# Patient Record
Sex: Female | Born: 1985 | Race: Black or African American | Hispanic: No | Marital: Married | Smoking: Never smoker
Health system: Southern US, Community
[De-identification: ages and names within clinical notes are randomized; demographics above are authoritative.]

## PROBLEM LIST (undated history)

## (undated) ENCOUNTER — Inpatient Hospital Stay (HOSPITAL_COMMUNITY): Payer: Self-pay

## (undated) DIAGNOSIS — Z3493 Encounter for supervision of normal pregnancy, unspecified, third trimester: Secondary | ICD-10-CM

## (undated) DIAGNOSIS — F419 Anxiety disorder, unspecified: Secondary | ICD-10-CM

## (undated) HISTORY — PX: NO PAST SURGERIES: SHX2092

---
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
## 2004-04-27 ENCOUNTER — Ambulatory Visit: Payer: Self-pay | Admitting: Family Medicine

## 2004-04-27 ENCOUNTER — Ambulatory Visit (HOSPITAL_COMMUNITY): Admission: RE | Admit: 2004-04-27 | Discharge: 2004-04-27 | Payer: Self-pay | Admitting: Family Medicine

## 2004-05-03 ENCOUNTER — Ambulatory Visit: Payer: Self-pay | Admitting: Family Medicine

## 2004-07-01 ENCOUNTER — Emergency Department (HOSPITAL_COMMUNITY): Admission: EM | Admit: 2004-07-01 | Discharge: 2004-07-01 | Payer: Self-pay | Admitting: Emergency Medicine

## 2004-07-02 ENCOUNTER — Emergency Department (HOSPITAL_COMMUNITY): Admission: EM | Admit: 2004-07-02 | Discharge: 2004-07-02 | Payer: Self-pay | Admitting: Emergency Medicine

## 2004-07-20 ENCOUNTER — Ambulatory Visit: Payer: Self-pay | Admitting: Family Medicine

## 2006-02-18 ENCOUNTER — Inpatient Hospital Stay (HOSPITAL_COMMUNITY): Admission: AD | Admit: 2006-02-18 | Discharge: 2006-02-18 | Payer: Self-pay | Admitting: Obstetrics and Gynecology

## 2006-02-24 ENCOUNTER — Inpatient Hospital Stay (HOSPITAL_COMMUNITY): Admission: AD | Admit: 2006-02-24 | Discharge: 2006-02-27 | Payer: Self-pay | Admitting: Obstetrics and Gynecology

## 2007-01-22 ENCOUNTER — Encounter: Payer: Self-pay | Admitting: Family Medicine

## 2007-03-18 ENCOUNTER — Emergency Department (HOSPITAL_COMMUNITY): Admission: EM | Admit: 2007-03-18 | Discharge: 2007-03-18 | Payer: Self-pay | Admitting: Family Medicine

## 2008-03-07 ENCOUNTER — Ambulatory Visit (HOSPITAL_COMMUNITY): Admission: RE | Admit: 2008-03-07 | Discharge: 2008-03-07 | Payer: Self-pay | Admitting: Occupational Medicine

## 2009-01-02 ENCOUNTER — Emergency Department (HOSPITAL_COMMUNITY): Admission: EM | Admit: 2009-01-02 | Discharge: 2009-01-02 | Payer: Self-pay | Admitting: Emergency Medicine

## 2009-03-09 ENCOUNTER — Emergency Department (HOSPITAL_COMMUNITY): Admission: EM | Admit: 2009-03-09 | Discharge: 2009-03-09 | Payer: Self-pay | Admitting: Family Medicine

## 2010-02-20 NOTE — Letter (Signed)
Summary: RPC chart  RPC chart   Imported By: Curtis Sites 07/26/2009 16:12:23  _____________________________________________________________________  External Attachment:    Type:   Image     Comment:   External Document

## 2010-06-08 NOTE — Discharge Summary (Signed)
NAMEMarland Kitchen  Susan Hart, Susan Hart       ACCOUNT NO.:  [id number]   MEDICAL RECORD NO.:  [id number]          PATIENT TYPE:  INP   LOCATION:  9138                          FACILITY:  [facility]   PHYSICIAN:  Sherron Monday, MD        DATE OF BIRTH:  Feb 23, 1985   DATE OF ADMISSION:  02/24/2006  DATE OF DISCHARGE:  02/27/2006                               DISCHARGE SUMMARY   ADMISSION DIAGNOSIS:  Intrauterine pregnancy with questionable loss of  fluid at term.   DISCHARGE DIAGNOSIS:  Intrauterine pregnancy with questionable loss of  fluid at term, delivered by spontaneous vaginal delivery.   HISTORY OF PRESENT ILLNESS:  A 25 year old G1, P0, at 40 weeks and 4  days, who presents with a complaint of questionable loss of fluid,  Nitrazine positive, fern negative, and no gross pool but mucousy  discharge.  It was thought that this likely confirmed her rupture of  membranes and given her post dates, she was sent for induction.  Her EDC  was February 20, 2006, by an 11-week ultrasound.  Prenatal care  uncomplicated except an ASCUS Pap with a negative colposcopy.  She will  repeat the Pap smear postpartum.   PAST MEDICAL HISTORY:  Not significant.   PAST SURGICAL HISTORY:  Not significant.   OB/GYN HISTORY:  She is a G1.  G1 is the present pregnancy.  There are  no complications.  Her GYN history:  She has a history of ASCUS Pap, had  a colposcopy.   MEDICATIONS:  None.   ALLERGIES:  None.   SOCIAL HISTORY:  Denies alcohol, tobacco or drug use during the  pregnancy.   PRENATAL LABORATORY DATA:  She is O positive, antibody screen negative.  Rubella immune.  Hepatitis B surface antigen negative.  HIV negative.  Gonorrhea negative.  Chlamydia negative.  Group B strep negative.   On admission she was afebrile with vital signs stable.  Her labor was  induced with Pitocin.  After admission an SROM, in fact, did occur and  moderate meconium was noted.  The patient received her epidural after  this.   An amnioinfusion was begun with the rupture and variables.  Her  course was otherwise uncomplicated.  She changed rapidly from 2-3 to 6  with some variables that were easily relieved with position change.  She  progressed to complete-complete, +3, and pushed to deliver a viable  female infant at 12:02 from OA to LOT with a mild shoulder dystocia  relieved with McRoberts.  The fetal scalp electrode fell off  approximately 2 minutes before delivery and we were unable to get the  heart rate again before delivery.  At delivery weight was 7 pounds 2  ounces, Apgars were 2 at one minute and 8 at five minutes.  The cord gas  was 7.30, venous cord gas 7.30.  The placenta was delivered intact at  12:05.  Periurethral laceration was repaired with 3-0 Vicryl in the  typical fashion.  EBL was less than 500 mL.  NICU was present at her  delivery to assess the infant.  Her postpartum course was relatively  uncomplicated.  She had one  temperature the evening of delivery day with  no other temperatures, and her fundus was nontender the whole time.  Otherwise, her vital signs remained stable.  Her hemoglobin decreased  from 14.2 to 12.2.  She had normal lochia the day of discharge and was  able to ambulate without difficulty and was tolerating p.o.  She was  discharged home with a prescription for Motrin, prenatal vitamins and  Vicodin.   DISCHARGE INFORMATION:  She is O positive.  Rubella immune.  Hemoglobin  14.3.  She will breast-feed.  She plans to start a contraceptive pill  like Seasonale at her 6-week checkup.      Sherron Monday, MD  Electronically Signed     JB/MEDQ  D:  02/27/2006  T:  02/27/2006  Job:  [id number]

## 2010-10-12 LAB — INFLUENZA A AND B ANTIGEN (CONVERTED LAB): Inflenza A Ag: NEGATIVE

## 2012-01-22 ENCOUNTER — Encounter (HOSPITAL_COMMUNITY): Payer: Self-pay | Admitting: Emergency Medicine

## 2012-01-22 ENCOUNTER — Emergency Department (INDEPENDENT_AMBULATORY_CARE_PROVIDER_SITE_OTHER)
Admission: EM | Admit: 2012-01-22 | Discharge: 2012-01-22 | Disposition: A | Discharge status: Home / Self Care | Payer: Self-pay | Source: Home / Self Care

## 2012-01-22 DIAGNOSIS — J45909 Unspecified asthma, uncomplicated: Secondary | ICD-10-CM

## 2012-01-22 DIAGNOSIS — J069 Acute upper respiratory infection, unspecified: Secondary | ICD-10-CM

## 2012-01-22 MED ORDER — ALBUTEROL SULFATE (5 MG/ML) 0.5% IN NEBU
2.5000 mg | INHALATION_SOLUTION | Freq: Once | RESPIRATORY_TRACT | Status: AC
  Administered 2012-01-22: 2.5 mg via RESPIRATORY_TRACT

## 2012-01-22 MED ORDER — PREDNISONE 20 MG PO TABS: ORAL_TABLET | ORAL | Status: DC

## 2012-01-22 MED ORDER — ALBUTEROL SULFATE (5 MG/ML) 0.5% IN NEBU
INHALATION_SOLUTION | RESPIRATORY_TRACT | Status: AC
  Filled 2012-01-22: qty 0.5

## 2012-01-22 MED ORDER — ALBUTEROL SULFATE HFA 108 (90 BASE) MCG/ACT IN AERS: 2.0000 | INHALATION_SPRAY | RESPIRATORY_TRACT | Status: DC | PRN

## 2012-01-22 MED ORDER — PHENYLEPHRINE-CHLORPHEN-DM 10-4-12.5 MG/5ML PO LIQD: 5.0000 mL | ORAL | Status: DC | PRN

## 2012-01-22 MED ORDER — IPRATROPIUM BROMIDE 0.02 % IN SOLN
0.5000 mg | Freq: Once | RESPIRATORY_TRACT | Status: AC
  Administered 2012-01-22: 0.5 mg via RESPIRATORY_TRACT

## 2012-01-22 NOTE — ED Provider Notes (Signed)
History     CSN: [id number]  Arrival date & time 01/22/12  1010   First MD Initiated Contact with Patient 01/22/12 1039      Chief Complaint  Patient presents with  . URI    (Consider location/radiation/quality/duration/timing/severity/associated sxs/prior treatment) HPI Comments: 27 year old female with a "bad cough" with chest pain associated with coughing for 4 days. She is also having some posttussive emesis that worsened when she had her throat swabbed in the exam room. She is complaining with a sore throat associated with cough only. Associated symptoms include runny nose, nasal congestion, headache and malaise. She denies fever, earache or shortness of breath. She has no history of asthma and is a nonsmoker   History reviewed. No pertinent past medical history.  History reviewed. No pertinent past surgical history.  No family history on file.  History  Substance Use Topics  . Smoking status: Never Smoker   . Smokeless tobacco: Not on file  . Alcohol Use: No    OB History    Grav Para Term Preterm Abortions TAB SAB Ect Mult Living                  Review of Systems  Constitutional: Negative for fever, chills and activity change.  HENT: Positive for congestion, sore throat, rhinorrhea and postnasal drip. Negative for facial swelling, trouble swallowing, neck pain and neck stiffness.   Eyes: Negative.   Respiratory: Positive for cough. Negative for shortness of breath.   Cardiovascular: Negative.   Gastrointestinal: Negative.   Genitourinary: Negative.   Skin: Negative for pallor and rash.  Neurological: Negative.   Psychiatric/Behavioral: Negative.     Allergies  Review of patient's allergies indicates no known allergies.  Home Medications   Current Outpatient Rx  Name  Route  Sig  Dispense  Refill  . ALBUTEROL SULFATE HFA 108 (90 BASE) MCG/ACT IN AERS   Inhalation   Inhale 2 puffs into the lungs every 4 (four) hours as needed for wheezing.   1  Inhaler   0   . PHENYLEPHRINE-CHLORPHEN-DM 10-25-10.5 MG/5ML PO LIQD   Oral   Take 5 mLs by mouth every 4 (four) hours as needed.   120 mL   0   . PREDNISONE 20 MG PO TABS      Take 3 tabs po on first day, 2 tabs second day, 2 tabs third day, 1 tab fourth day, 1 tab 5th day. Take with food.   9 tablet   0     BP 122/70  Pulse 80  Temp 99.6 F (37.6 C) (Oral)  Resp 18  SpO2 100%  LMP 01/13/2012  Physical Exam  Constitutional: She is oriented to person, place, and time. She appears well-developed and well-nourished. No distress.  HENT:       Bilateral TMs with patches of erythema. No bulging or evidence of rupture. Retraction of the right TM. Oropharynx injected. No exudates.  Eyes: EOM are normal.  Neck: Normal range of motion. Neck supple.  Cardiovascular: Normal rate, regular rhythm and normal heart sounds.   Pulmonary/Chest: Effort normal. No respiratory distress. She has wheezes. She has no rales.       Breath sounds are clear with normal respirations however with forced expiration and cough there is bilateral coarseness. As I entered the room she was having coughing spasms which was causing posttussive regurgitation/emesis.  Musculoskeletal: Normal range of motion. She exhibits no edema.  Lymphadenopathy:    She has no cervical adenopathy.  Neurological: She  is alert and oriented to person, place, and time.  Skin: Skin is warm and dry. No rash noted.  Psychiatric: She has a normal mood and affect.    ED Course  Procedures (including critical care time)  Labs Reviewed - No data to display No results found.   1. URI (upper respiratory infection)   2. RAD (reactive airway disease)       MDM  Duo neb Post neb the patient states she felt some better. Auscultation she had no more wheezes and coarseness with deep inspiration and expiration. Cough she did have some coarseness. She is moving air quite well prior to discharge and inspiratory equals expiratory. She  is discharged in stable condition. She initially developed URI symptoms followed by mild reactive airway disease symptoms. She will be treated with albuterol HFA 2 puffs every 4 hours when necessary cough and wheeze Norell CS 1 teaspoon every 4 hours when necessary cough congestion and drainage. Prednisone 20 mg Tablets to 5 day taper Drink plenty of fluids and stay well-hydrated She was advised to seek medical attention should she develop shortness of breath, fever and additional coughing. She is discharged in a stable condition and states she feels that she may be able to go to work tomorrow.         Hayden Rasmussen, NP 01/22/12 1141

## 2012-01-22 NOTE — ED Notes (Signed)
Pt c/o cold sx x5 days... Sx include: headache, cough, chest discomfort due to cough, nasal congestion, runny nose, nauseas... Denies: fevers, vomiting, diarrhea... She is alert w/no signs of acute distress.

## 2012-01-23 NOTE — ED Provider Notes (Signed)
Medical screening examination/treatment/procedure(s) were performed by resident physician or non-physician practitioner and as supervising physician I was immediately available for consultation/collaboration.   Shakevia Sarris DOUGLAS MD.    Hunt Zajicek D Anara Cowman, MD 01/23/12 2028 

## 2012-04-21 ENCOUNTER — Emergency Department (INDEPENDENT_AMBULATORY_CARE_PROVIDER_SITE_OTHER)
Admission: EM | Admit: 2012-04-21 | Discharge: 2012-04-21 | Disposition: A | Discharge status: Home / Self Care | Payer: Self-pay | Source: Home / Self Care

## 2012-04-21 ENCOUNTER — Encounter (HOSPITAL_COMMUNITY): Payer: Self-pay | Admitting: *Deleted

## 2012-04-21 DIAGNOSIS — S139XXA Sprain of joints and ligaments of unspecified parts of neck, initial encounter: Secondary | ICD-10-CM

## 2012-04-21 DIAGNOSIS — L218 Other seborrheic dermatitis: Secondary | ICD-10-CM

## 2012-04-21 DIAGNOSIS — L219 Seborrheic dermatitis, unspecified: Secondary | ICD-10-CM

## 2012-04-21 DIAGNOSIS — S161XXA Strain of muscle, fascia and tendon at neck level, initial encounter: Secondary | ICD-10-CM

## 2012-04-21 DIAGNOSIS — M542 Cervicalgia: Secondary | ICD-10-CM

## 2012-04-21 MED ORDER — TRIAMCINOLONE ACETONIDE 0.1 % EX CREA: TOPICAL_CREAM | Freq: Two times a day (BID) | CUTANEOUS | Status: DC

## 2012-04-21 MED ORDER — NAPROXEN 375 MG PO TABS: 375.0000 mg | ORAL_TABLET | Freq: Two times a day (BID) | ORAL | Status: DC

## 2012-04-21 MED ORDER — TRAMADOL HCL 50 MG PO TABS: 50.0000 mg | ORAL_TABLET | Freq: Four times a day (QID) | ORAL | Status: DC | PRN

## 2012-04-21 NOTE — ED Notes (Signed)
Noticed lump to right posterior scalp approx 2 wks ago; has had significant pruritis to area.  Last night lump became much more painful, radiating down neck and causing HAs.  Has tried left-over hydrocodone, Tyl, and heating pads without relief.  Denies fevers.  Nickel-sized lump noted with palpation.

## 2012-04-21 NOTE — ED Provider Notes (Signed)
History     CSN: [id number]  Arrival date & time 04/21/12  1859   First MD Initiated Contact with Patient 04/21/12 1920      Chief Complaint  Patient presents with  . Mass    (Consider location/radiation/quality/duration/timing/severity/associated sxs/prior treatment) HPI Comments: 27 year old female noticed a small area of thickening to the scalp at the vertex approximately 2 weeks ago. It is extremely pruritic. She continues to scratch it and there is a traction care loss in the area. The area measures approximately 2 x 2 centimeters. There is no flaking and superficial desquamation in the area. No erythema, drainage or signs of infection.  Second complaint is the onset of right cervical muscle pain beginning yesterday. He begins in the right occiput and tracks down the right scalene muscle. The pain is worse with certain movements and rotation of the neck and head. No known injury.   History reviewed. No pertinent past medical history.  History reviewed. No pertinent past surgical history.  No family history on file.  History  Substance Use Topics  . Smoking status: Never Smoker   . Smokeless tobacco: Not on file  . Alcohol Use: No    OB History   Grav Para Term Preterm Abortions TAB SAB Ect Mult Living                  Review of Systems  Constitutional: Negative.   HENT: Positive for neck pain. Negative for congestion, sore throat and neck stiffness.   Gastrointestinal: Negative.   Genitourinary: Negative.   Skin: Positive for rash.  Neurological: Negative.   Psychiatric/Behavioral: Negative.     Allergies  Review of patient's allergies indicates no known allergies.  Home Medications   Current Outpatient Rx  Name  Route  Sig  Dispense  Refill  . albuterol (PROVENTIL HFA;VENTOLIN HFA) 108 (90 BASE) MCG/ACT inhaler   Inhalation   Inhale 2 puffs into the lungs every 4 (four) hours as needed for wheezing.   1 Inhaler   0   . naproxen (NAPROSYN) 375 MG  tablet   Oral   Take 1 tablet (375 mg total) by mouth 2 (two) times daily.   20 tablet   0   . Phenylephrine-Chlorphen-DM 10-25-10.5 MG/5ML LIQD   Oral   Take 5 mLs by mouth every 4 (four) hours as needed.   120 mL   0   . predniSONE (DELTASONE) 20 MG tablet      Take 3 tabs po on first day, 2 tabs second day, 2 tabs third day, 1 tab fourth day, 1 tab 5th day. Take with food.   9 tablet   0   . traMADol (ULTRAM) 50 MG tablet   Oral   Take 1 tablet (50 mg total) by mouth every 6 (six) hours as needed for pain.   15 tablet   0   . triamcinolone cream (KENALOG) 0.1 %   Topical   Apply topically 2 (two) times daily. Apply for 2 weeks. May use on face   30 g   0     BP 115/58  Pulse 56  Temp(Src) 98.1 F (36.7 C) (Oral)  Resp 16  SpO2 98%  LMP 04/15/2012  Physical Exam  Nursing note and vitals reviewed. Constitutional: She is oriented to person, place, and time. She appears well-developed and well-nourished. No distress.  Eyes: Conjunctivae and EOM are normal.  Neck:  Tenderness of the right scalene muscle. Range of motion is complete but slow.  Cardiovascular:  Normal rate.   Pulmonary/Chest: Effort normal.  Musculoskeletal: Normal range of motion. She exhibits no edema and no tenderness.  Neurological: She is alert and oriented to person, place, and time.  Skin: Skin is warm and dry.  2 cm x 2 cm annular area of slightly raised and taken dermis with scaling and desquamation. No signs of infection or drainage.  Psychiatric: She has a normal mood and affect.    ED Course  Procedures (including critical care time)  Labs Reviewed - No data to display No results found.   1. Seborrheic dermatitis of scalp   2. Cervicalgia   3. Strain of neck muscle, initial encounter       MDM  Triamcinolone cream apply to affected part of the scalp BID and and Naprosyn 375 mg twice a day when necessary neck pain. And tramadol 50 mg every 6 hours when necessary pain Apply  heat to the area of the neck were soreness. Perform stretches is demonstrated. Over her primary care doctor later this month as scheduled. Recheck promptly for any symptoms problems worsening      Hayden Rasmussen, NP 04/21/12 2057

## 2012-04-23 NOTE — ED Provider Notes (Signed)
Medical screening examination/treatment/procedure(s) were performed by resident physician or non-physician practitioner and as supervising physician I was immediately available for consultation/collaboration.   Trey Gulbranson DOUGLAS MD.   Lynnie Koehler D Elga Santy, MD 04/23/12 1954 

## 2013-04-19 ENCOUNTER — Ambulatory Visit: Payer: Self-pay | Admitting: Internal Medicine

## 2013-11-16 LAB — OB RESULTS CONSOLE GC/CHLAMYDIA
Chlamydia: NEGATIVE
GC PROBE AMP, GENITAL: NEGATIVE

## 2013-11-16 LAB — OB RESULTS CONSOLE ABO/RH: RH TYPE: POSITIVE

## 2013-11-16 LAB — OB RESULTS CONSOLE RPR: RPR: NONREACTIVE

## 2013-11-16 LAB — OB RESULTS CONSOLE HEPATITIS B SURFACE ANTIGEN: Hepatitis B Surface Ag: NEGATIVE

## 2013-11-16 LAB — OB RESULTS CONSOLE ANTIBODY SCREEN: ANTIBODY SCREEN: NEGATIVE

## 2013-11-16 LAB — OB RESULTS CONSOLE HIV ANTIBODY (ROUTINE TESTING): HIV: NONREACTIVE

## 2013-11-16 LAB — OB RESULTS CONSOLE RUBELLA ANTIBODY, IGM: RUBELLA: IMMUNE

## 2014-01-02 ENCOUNTER — Inpatient Hospital Stay (HOSPITAL_COMMUNITY)
Admission: AD | Admit: 2014-01-02 | Discharge: 2014-01-03 | Disposition: A | Discharge status: Home / Self Care | Payer: Medicaid Other | Source: Ambulatory Visit | Attending: Obstetrics and Gynecology | Admitting: Obstetrics and Gynecology

## 2014-01-02 ENCOUNTER — Encounter (HOSPITAL_COMMUNITY): Payer: Self-pay

## 2014-01-02 DIAGNOSIS — O99342 Other mental disorders complicating pregnancy, second trimester: Secondary | ICD-10-CM | POA: Insufficient documentation

## 2014-01-02 DIAGNOSIS — F329 Major depressive disorder, single episode, unspecified: Secondary | ICD-10-CM | POA: Diagnosis not present

## 2014-01-02 DIAGNOSIS — Z3A21 21 weeks gestation of pregnancy: Secondary | ICD-10-CM | POA: Insufficient documentation

## 2014-01-02 DIAGNOSIS — O368120 Decreased fetal movements, second trimester, not applicable or unspecified: Secondary | ICD-10-CM | POA: Diagnosis present

## 2014-01-02 HISTORY — DX: Anxiety disorder, unspecified: F41.9

## 2014-01-02 LAB — URINALYSIS, ROUTINE W REFLEX MICROSCOPIC
BILIRUBIN URINE: NEGATIVE
GLUCOSE, UA: NEGATIVE mg/dL
Hgb urine dipstick: NEGATIVE
Ketones, ur: NEGATIVE mg/dL
Nitrite: NEGATIVE
PH: 6 (ref 5.0–8.0)
Protein, ur: NEGATIVE mg/dL
Specific Gravity, Urine: 1.02 (ref 1.005–1.030)
UROBILINOGEN UA: 0.2 mg/dL (ref 0.0–1.0)

## 2014-01-02 LAB — URINE MICROSCOPIC-ADD ON

## 2014-01-02 NOTE — MAU Provider Note (Signed)
  History     CSN: [id number]  Arrival date and time: 01/02/14 2259   First Susan Hart Initiated Contact with Patient 01/02/14 2350      Chief Complaint  Patient presents with  . Anxiety  . Dizziness  . Decreased Fetal Movement   HPI  Susan Hart is a 28 y.o. G2P1001 at 1279w1d who presents today with anxiety and depression. She states that she has had depression since the beginning of this pregnancy. She denies any history of depression. She states that today she just lost it. She had a panic attack, and "felt terrible". She states that the fetus has not been as active. She denies any SI or HI.   She states that since the pregnancy started she has been feeling depressed about once a week. She states that it doesn't usually last all day, and she doesn't always have a panic attack with the feelings of depression. She states that today has so far been the worst, and lasted the longest.   She states that [place] shopping in [place] and has just been overwhelmed today. She states that her next appointment is 01/10/14. 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 She states that has been floated a lot recently, and has been doing a lot of sitter cases. She states that that work can be depressing.   Past Medical History  Diagnosis Date  . Anxiety     just since being pregnant, 2015    Past Surgical History  Procedure Laterality Date  . No past surgeries      History reviewed. No pertinent family history.  History  Substance Use Topics  . Smoking status: Never Smoker   . Smokeless tobacco: Not on file  . Alcohol Use: No    Allergies: No Known Allergies  Prescriptions prior to admission  Medication Sig Dispense Refill Last Dose  . acetaminophen (TYLENOL) 500 MG tablet Take 500 mg by mouth every 6 (six) hours as needed for headache.   Past Month at Unknown time  . Prenatal Vit-Fe Fumarate-FA (PRENATAL MULTIVITAMIN) TABS tablet Take 1 tablet by mouth  daily at 12 noon.   01/02/2014 at Unknown time    ROS Physical Exam   Blood pressure 130/58, pulse 69, temperature 98.4 F (36.9 C), temperature source Oral, resp. rate 18, height 5\' 11"  (1.803 m), weight 84.641 kg (186 lb 9.6 oz), last menstrual period 08/08/2013, SpO2 100 %.  Physical Exam  Nursing note and vitals reviewed. Constitutional: She is oriented to person, place, and time. She appears well-developed and well-nourished. No distress.  Cardiovascular: Normal rate.   Respiratory: Effort normal.  GI: Soft. There is no tenderness. There is no rebound.  Neurological: She is alert and oriented to person, place, and time.  Skin: Skin is warm and dry.  Psychiatric: She has a normal mood and affect.   FHT: 150 with doppler  MAU Course  Procedures  0042: D/W Dr. Senaida Oresichardson, ok for DC home. Patient may have #5 xanax if she desires. Then FU with primary as scheduled to consider medication options   Assessment and Plan   1. Depression complicating pregnancy in second trimester, antepartum    Discussed mediation use PRN Return to MAU for any SI or HI FU with primary OB as scheduled to consider medication options.   Tawnya CrookHogan, Heather Donovan 01/02/2014, 11:51 PM

## 2014-01-02 NOTE — MAU Note (Signed)
Depressed mood x 8 hours. Decreased fetal movement for the last few hours. Had a panic attack at 9 pm. Feels dizzy & jittery. Denies vaginal bleeding/LOF/vaginal discharge/contractions.

## 2014-01-03 DIAGNOSIS — F329 Major depressive disorder, single episode, unspecified: Secondary | ICD-10-CM

## 2014-01-03 DIAGNOSIS — O99342 Other mental disorders complicating pregnancy, second trimester: Secondary | ICD-10-CM

## 2014-01-03 MED ORDER — ALPRAZOLAM 0.25 MG PO TABS: 0.2500 mg | ORAL_TABLET | ORAL | Status: DC | PRN

## 2014-01-03 NOTE — Discharge Instructions (Signed)

## 2014-01-21 NOTE — L&D Delivery Note (Signed)
Delivery Note Pt pushed very well for 15-20 minutes.  At 1:10 AM a viable and healthy female was delivered via  (Presentation: OA:LOT).  APGAR: 9,9 ; weight  P.   Placenta status: delivered, intact .  Cord: 3VC  with the following complications: none.    Anesthesia: Epidural  Episiotomy:  none Lacerations:  none Suture Repair: N/A Est. Blood Loss (mL):  400cc  Mom to postpartum.  Baby to Couplet care / Skin to Skin.  Bovard-Stuckert, Holston Oyama 05/12/2014, 1:29 AM  Br/Bo; [place]

## 2014-03-07 ENCOUNTER — Inpatient Hospital Stay (HOSPITAL_COMMUNITY)
Admission: AD | Admit: 2014-03-07 | Discharge: 2014-03-07 | Disposition: A | Discharge status: Home / Self Care | Payer: Medicaid Other | Source: Ambulatory Visit | Attending: Obstetrics and Gynecology | Admitting: Obstetrics and Gynecology

## 2014-03-07 ENCOUNTER — Encounter (HOSPITAL_COMMUNITY): Payer: Self-pay | Admitting: *Deleted

## 2014-03-07 DIAGNOSIS — B9689 Other specified bacterial agents as the cause of diseases classified elsewhere: Secondary | ICD-10-CM | POA: Insufficient documentation

## 2014-03-07 DIAGNOSIS — N760 Acute vaginitis: Secondary | ICD-10-CM | POA: Insufficient documentation

## 2014-03-07 DIAGNOSIS — O23593 Infection of other part of genital tract in pregnancy, third trimester: Secondary | ICD-10-CM | POA: Insufficient documentation

## 2014-03-07 DIAGNOSIS — O98813 Other maternal infectious and parasitic diseases complicating pregnancy, third trimester: Secondary | ICD-10-CM

## 2014-03-07 DIAGNOSIS — A499 Bacterial infection, unspecified: Secondary | ICD-10-CM

## 2014-03-07 DIAGNOSIS — Z3A30 30 weeks gestation of pregnancy: Secondary | ICD-10-CM

## 2014-03-07 DIAGNOSIS — O4693 Antepartum hemorrhage, unspecified, third trimester: Secondary | ICD-10-CM

## 2014-03-07 DIAGNOSIS — R109 Unspecified abdominal pain: Secondary | ICD-10-CM | POA: Diagnosis present

## 2014-03-07 LAB — WET PREP, GENITAL
TRICH WET PREP: NONE SEEN
YEAST WET PREP: NONE SEEN

## 2014-03-07 LAB — URINE MICROSCOPIC-ADD ON

## 2014-03-07 LAB — URINALYSIS, ROUTINE W REFLEX MICROSCOPIC
BILIRUBIN URINE: NEGATIVE
Glucose, UA: NEGATIVE mg/dL
KETONES UR: NEGATIVE mg/dL
Nitrite: NEGATIVE
PROTEIN: NEGATIVE mg/dL
SPECIFIC GRAVITY, URINE: 1.015 (ref 1.005–1.030)
UROBILINOGEN UA: 0.2 mg/dL (ref 0.0–1.0)
pH: 6 (ref 5.0–8.0)

## 2014-03-07 MED ORDER — METRONIDAZOLE 500 MG PO TABS: 500.0000 mg | ORAL_TABLET | Freq: Two times a day (BID) | ORAL | Status: DC

## 2014-03-07 NOTE — MAU Note (Signed)
Had pink discharge last week, was seen @ MD office.  Pt noted dark red blood this morning when she went to the bathroom, having LRQ pain that started this a.m.  Urinary frequency.

## 2014-03-07 NOTE — Discharge Instructions (Signed)
Bacterial Vaginosis Bacterial vaginosis is a vaginal infection that occurs when the normal balance of bacteria in the vagina is disrupted. It results from an overgrowth of certain bacteria. This is the most common vaginal infection in women of childbearing age. Treatment is important to prevent complications, especially in pregnant women, as it can cause a premature delivery. CAUSES  Bacterial vaginosis is caused by an increase in harmful bacteria that are normally present in smaller amounts in the vagina. Several different kinds of bacteria can cause bacterial vaginosis. However, the reason that the condition develops is not fully understood. RISK FACTORS Certain activities or behaviors can put you at an increased risk of developing bacterial vaginosis, including:  Having a new sex partner or multiple sex partners.  Douching.  Using an intrauterine device (IUD) for contraception. Women do not get bacterial vaginosis from toilet seats, bedding, swimming pools, or contact with objects around them. SIGNS AND SYMPTOMS  Some women with bacterial vaginosis have no signs or symptoms. Common symptoms include:  Grey vaginal discharge.  A fishlike odor with discharge, especially after sexual intercourse.  Itching or burning of the vagina and vulva.  Burning or pain with urination. DIAGNOSIS  Your health care provider will take a medical history and examine the vagina for signs of bacterial vaginosis. A sample of vaginal fluid may be taken. Your health care provider will look at this sample under a microscope to check for bacteria and abnormal cells. A vaginal pH test may also be done.  TREATMENT  Bacterial vaginosis may be treated with antibiotic medicines. These may be given in the form of a pill or a vaginal cream. A second round of antibiotics may be prescribed if the condition comes back after treatment.  HOME CARE INSTRUCTIONS   Only take over-the-counter or prescription medicines as  directed by your health care provider.  If antibiotic medicine was prescribed, take it as directed. Make sure you finish it even if you start to feel better.  Do not have sex until treatment is completed.  Tell all sexual partners that you have a vaginal infection. They should see their health care provider and be treated if they have problems, such as a mild rash or itching.  Practice safe sex by using condoms and only having one sex partner. SEEK MEDICAL CARE IF:   Your symptoms are not improving after 3 days of treatment.  You have increased discharge or pain.  You have a fever. MAKE SURE YOU:   Understand these instructions.  Will watch your condition.  Will get help right away if you are not doing well or get worse. FOR MORE INFORMATION  Centers for Disease Control and Prevention, Division of STD Prevention: [employer]s.co.za American Sexual Health Association (ASHA): www.ashastd.org  Document Released: 01/07/2005 Document Revised: 10/28/2012 Document Reviewed: 08/19/2012 [facility] Patient Information 2015 [place], [place]. This information is not intended to replace advice given to you by your health care provider. Make sure you discuss any questions you have with your health care provider.   Pelvic Rest Pelvic rest is sometimes recommended for women when:   The placenta is partially or completely covering the opening of the cervix (placenta previa).  There is bleeding between the uterine wall and the amniotic sac in the first trimester (subchorionic hemorrhage).  The cervix begins to open without labor starting (incompetent cervix, cervical insufficiency).  The labor is too early (preterm labor). HOME CARE INSTRUCTIONS  Do not have sexual intercourse, stimulation, or an orgasm.  Do not use tampons,  douche, or put anything in the vagina.  Do not lift anything over 10 pounds (4.5 kg).  Avoid strenuous activity or straining your pelvic muscles. SEEK MEDICAL CARE  IF:  You have any vaginal bleeding during pregnancy. Treat this as a potential emergency.  You have cramping pain felt low in the stomach (stronger than menstrual cramps).  You notice vaginal discharge (watery, mucus, or bloody).  You have a low, dull backache.  There are regular contractions or uterine tightening. SEEK IMMEDIATE MEDICAL CARE IF: You have vaginal bleeding and have placenta previa.  Document Released: 05/04/2010 Document Revised: 04/01/2011 Document Reviewed: 05/04/2010 [facility] Patient Information 2015 [place]. This information is not intended to replace advice given to you by your health care provider. Make sure you discuss any questions you have with your health care provider.

## 2014-03-07 NOTE — MAU Provider Note (Signed)
History     CSN: [id number]  Arrival date and time: 03/07/14 1059   None     Chief Complaint  Patient presents with  . Vaginal Bleeding  . Abdominal Pain   HPI This is a 29 y.o. female at 1429w2d who presents with c/o bleeding since last week, which got darker red today. Has some lower abdominal and low back pain at times. Reports some urinary frequency.   RN Note:  Expand All Collapse All   Had pink discharge last week, was seen @ MD office. Pt noted dark red blood this morning when she went to the bathroom, having LRQ pain that started this a.m. Urinary frequency.         OB History    Gravida Para Term Preterm AB TAB SAB Ectopic Multiple Living   2 1 1       1       Past Medical History  Diagnosis Date  . Anxiety     just since being pregnant, 2015    Past Surgical History  Procedure Laterality Date  . No past surgeries      History reviewed. No pertinent family history.  History  Substance Use Topics  . Smoking status: Never Smoker   . Smokeless tobacco: Not on file  . Alcohol Use: No    Allergies:  Allergies  Allergen Reactions  . Diflucan [Fluconazole] Itching    Prescriptions prior to admission  Medication Sig Dispense Refill Last Dose  . acetaminophen (TYLENOL) 500 MG tablet Take 1,000 mg by mouth every 6 (six) hours as needed for headache.    Past Month at Unknown time  . flintstones complete (FLINTSTONES) 60 MG chewable tablet Chew 2 tablets by mouth every evening.   03/06/2014 at Unknown time  . IRON PO Take 1 tablet by mouth every evening.   03/06/2014 at Unknown time  . ALPRAZolam (XANAX) 0.25 MG tablet Take 1 tablet (0.25 mg total) by mouth as needed for anxiety. (Patient not taking: Reported on 03/07/2014) 5 tablet 0     Review of Systems  Constitutional: Negative for fever, chills and malaise/fatigue.  Gastrointestinal: Positive for abdominal pain. Negative for nausea, vomiting, diarrhea and constipation.  Genitourinary: Positive for  frequency. Negative for dysuria.  Neurological: Negative for dizziness.   Physical Exam   Blood pressure 117/59, pulse 79, temperature 98.7 F (37.1 C), temperature source Oral, resp. rate 18, last menstrual period 08/08/2013.  Physical Exam  Constitutional: She is oriented to person, place, and time. She appears well-developed and well-nourished. No distress.  HENT:  Head: Normocephalic.  Cardiovascular: Normal rate.   Respiratory: Effort normal.  GI: Soft. She exhibits no distension. There is no tenderness. There is no rebound and no guarding.  Genitourinary: Vaginal discharge (Friable cervix, cervix long and closed) found.  Musculoskeletal: Normal range of motion.  Neurological: She is alert and oriented to person, place, and time.  Skin: Skin is warm and dry.  Psychiatric: She has a normal mood and affect.   FHR reactive Occasional uterine irritability noted, not consistent. Patient states does not feel much pain anywhere  MAU Course  Procedures  MDM Results for orders placed or performed during the hospital encounter of 03/07/14 (from the past 24 hour(s))  Urinalysis, Routine w reflex microscopic     Status: Abnormal   Collection Time: 03/07/14 11:10 AM  Result Value Ref Range   Color, Urine YELLOW YELLOW   APPearance CLEAR CLEAR   Specific Gravity, Urine 1.015 1.005 - 1.030  pH 6.0 5.0 - 8.0   Glucose, UA NEGATIVE NEGATIVE mg/dL   Hgb urine dipstick SMALL (A) NEGATIVE   Bilirubin Urine NEGATIVE NEGATIVE   Ketones, ur NEGATIVE NEGATIVE mg/dL   Protein, ur NEGATIVE NEGATIVE mg/dL   Urobilinogen, UA 0.2 0.0 - 1.0 mg/dL   Nitrite NEGATIVE NEGATIVE   Leukocytes, UA LARGE (A) NEGATIVE  Urine microscopic-add on     Status: Abnormal   Collection Time: 03/07/14 11:10 AM  Result Value Ref Range   Squamous Epithelial / LPF FEW (A) RARE   WBC, UA 0-2 <3 WBC/hpf   RBC / HPF 0-2 <3 RBC/hpf  Wet prep, genital     Status: Abnormal   Collection Time: 03/07/14 12:31 PM   Result Value Ref Range   Yeast Wet Prep HPF POC NONE SEEN NONE SEEN   Trich, Wet Prep NONE SEEN NONE SEEN   Clue Cells Wet Prep HPF POC MODERATE (A) NONE SEEN   WBC, Wet Prep HPF POC MODERATE (A) NONE SEEN     Assessment and Plan  A:  SIUP at [redacted]w[redacted]d        Cervical friability        BV  P:  Discussed with Dr Jackelyn Knife       Rx Flagyl (states allergy was not to Diflucan, but to a yeast cream)       Pelvic rest emphasized       Follow up in office, has appt Thursday  [facility] 03/07/2014, 12:16 PM

## 2014-03-08 LAB — GC/CHLAMYDIA PROBE AMP (~~LOC~~) NOT AT ARMC
CHLAMYDIA, DNA PROBE: POSITIVE — AB
NEISSERIA GONORRHEA: NEGATIVE

## 2014-03-08 NOTE — Progress Notes (Signed)
FHT from 2-15 reviewed.  Reactive NST, no significant decels, some uterine irritability.

## 2014-03-09 ENCOUNTER — Encounter (HOSPITAL_COMMUNITY): Payer: Self-pay | Admitting: Advanced Practice Midwife

## 2014-03-09 ENCOUNTER — Inpatient Hospital Stay (HOSPITAL_COMMUNITY)
Admission: AD | Admit: 2014-03-09 | Discharge: 2014-03-09 | Disposition: A | Discharge status: Home / Self Care | Payer: Medicaid Other | Source: Ambulatory Visit | Attending: Obstetrics and Gynecology | Admitting: Obstetrics and Gynecology

## 2014-03-09 ENCOUNTER — Encounter (HOSPITAL_COMMUNITY): Payer: Self-pay | Admitting: *Deleted

## 2014-03-09 DIAGNOSIS — O99343 Other mental disorders complicating pregnancy, third trimester: Secondary | ICD-10-CM

## 2014-03-09 DIAGNOSIS — Z3A30 30 weeks gestation of pregnancy: Secondary | ICD-10-CM | POA: Insufficient documentation

## 2014-03-09 DIAGNOSIS — O98313 Other infections with a predominantly sexual mode of transmission complicating pregnancy, third trimester: Secondary | ICD-10-CM | POA: Insufficient documentation

## 2014-03-09 DIAGNOSIS — A568 Sexually transmitted chlamydial infection of other sites: Secondary | ICD-10-CM | POA: Diagnosis not present

## 2014-03-09 DIAGNOSIS — O98319 Other infections with a predominantly sexual mode of transmission complicating pregnancy, unspecified trimester: Secondary | ICD-10-CM

## 2014-03-09 DIAGNOSIS — O98819 Other maternal infectious and parasitic diseases complicating pregnancy, unspecified trimester: Secondary | ICD-10-CM

## 2014-03-09 DIAGNOSIS — O368130 Decreased fetal movements, third trimester, not applicable or unspecified: Secondary | ICD-10-CM | POA: Insufficient documentation

## 2014-03-09 DIAGNOSIS — A749 Chlamydial infection, unspecified: Secondary | ICD-10-CM | POA: Insufficient documentation

## 2014-03-09 DIAGNOSIS — F419 Anxiety disorder, unspecified: Secondary | ICD-10-CM

## 2014-03-09 MED ORDER — AZITHROMYCIN 250 MG PO TABS
1000.0000 mg | ORAL_TABLET | Freq: Once | ORAL | Status: AC
  Administered 2014-03-09: 1000 mg via ORAL
  Filled 2014-03-09: qty 4

## 2014-03-09 MED ORDER — HYDROXYZINE PAMOATE 25 MG PO CAPS: 25.0000 mg | ORAL_CAPSULE | Freq: Three times a day (TID) | ORAL | Status: DC | PRN

## 2014-03-09 NOTE — MAU Provider Note (Signed)
History     CSN: [id number]  Arrival date and time: 03/09/14 1133   First Provider Initiated Contact with Patient 03/09/14 1231      Chief Complaint  Patient presents with  . Anxiety  . Decreased Fetal Movement   HPI   [name] is a 29 y.o. female G2P1001 at [redacted]w[redacted]d who presents with anxiety and decreased fetal movement.   She was called yesterday and told that her chlamydia swab was positive. She was told that the RX went to her pharmacy. She went last night to fill it and it was not ready. Since she found out about the chlamydia she has been extremely upset and anxious. She has a history of anxiety since she found out she was pregnant. She took xanax once and did not like the way it made her feel.    + fetal movement since her arrival to MAU.  Denies vaginal bleeding or leaking of fluid  She denies anxiety currently.   OB History    Gravida Para Term Preterm AB TAB SAB Ectopic Multiple Living   Past Medical History  Diagnosis Date  . Anxiety     just since being pregnant, 2015    Past Surgical History  Procedure Laterality Date  . No past surgeries      Family History  Problem Relation Age of Onset  . Alcohol abuse Neg Hx   . Arthritis Neg Hx   . Asthma Neg Hx   . Birth defects Neg Hx   . Cancer Neg Hx   . COPD Neg Hx   . Depression Neg Hx   . Diabetes Neg Hx   . Early death Neg Hx   . Drug abuse Neg Hx   . Hearing loss Neg Hx   . Heart disease Neg Hx   . Hyperlipidemia Neg Hx   . Hypertension Neg Hx   . Kidney disease Neg Hx   . Learning disabilities Neg Hx   . Mental illness Neg Hx   . Mental retardation Neg Hx   . Miscarriages / Stillbirths Neg Hx   . Stroke Neg Hx   . Vision loss Neg Hx   . Varicose Veins Neg Hx     History  Substance Use Topics  . Smoking status: Never Smoker   . Smokeless tobacco: Not on file  . Alcohol Use: No    Allergies:  Allergies  Allergen Reactions  . Diflucan [Fluconazole]  Itching    Prescriptions prior to admission  Medication Sig Dispense Refill Last Dose  . acetaminophen (TYLENOL) 500 MG tablet Take 1,000 mg by mouth every 6 (six) hours as needed for headache.    Past Month at Unknown time  . flintstones complete (FLINTSTONES) 60 MG chewable tablet Chew 2 tablets by mouth every evening.   03/06/2014 at Unknown time  . IRON PO Take 1 tablet by mouth every evening.   03/06/2014 at Unknown time  . metroNIDAZOLE (FLAGYL) 500 MG tablet Take 1 tablet (500 mg total) by mouth 2 (two) times daily. 14 tablet 0    Results for orders placed or performed during the hospital encounter of 03/07/14 (from the past 72 hour(s))  GC/Chlamydia probe amp ([place])     Status: Abnormal   Collection Time: 03/07/14 12:00 AM  Result Value Ref Range   Chlamydia **CT: POSITIVE** (A)     Comment: Normal Reference Range - Negative  Neisseria gonorrhea NG: Negative     Comment: Normal Reference Range - Negative  Urinalysis, Routine w reflex microscopic     Status: Abnormal   Collection Time: 03/07/14 11:10 AM  Result Value Ref Range   Color, Urine YELLOW YELLOW   APPearance CLEAR CLEAR   Specific Gravity, Urine 1.015 1.005 - 1.030   pH 6.0 5.0 - 8.0   Glucose, UA NEGATIVE NEGATIVE mg/dL   Hgb urine dipstick SMALL (A) NEGATIVE   Bilirubin Urine NEGATIVE NEGATIVE   Ketones, ur NEGATIVE NEGATIVE mg/dL   Protein, ur NEGATIVE NEGATIVE mg/dL   Urobilinogen, UA 0.2 0.0 - 1.0 mg/dL   Nitrite NEGATIVE NEGATIVE   Leukocytes, UA LARGE (A) NEGATIVE  Urine microscopic-add on     Status: Abnormal   Collection Time: 03/07/14 11:10 AM  Result Value Ref Range   Squamous Epithelial / LPF FEW (A) RARE   WBC, UA 0-2 <3 WBC/hpf   RBC / HPF 0-2 <3 RBC/hpf  Wet prep, genital     Status: Abnormal   Collection Time: 03/07/14 12:31 PM  Result Value Ref Range   Yeast Wet Prep HPF POC NONE SEEN NONE SEEN   Trich, Wet Prep NONE SEEN NONE SEEN   Clue Cells Wet Prep HPF POC MODERATE (A) NONE SEEN    WBC, Wet Prep HPF POC MODERATE (A) NONE SEEN    Comment: MANY BACTERIA SEEN    Review of Systems  Constitutional: Negative for fever and chills.  Gastrointestinal: Negative for nausea, vomiting and abdominal pain.  Genitourinary: Negative for dysuria, urgency, frequency and hematuria.   Physical Exam   Blood pressure 132/59, pulse 60, temperature 99.1 F (37.3 C), temperature source Oral, resp. rate 17, height 5' 10.47" (1.79 m), weight 88.905 kg (196 lb), last menstrual period 08/08/2013.  Physical Exam  Constitutional: She is oriented to person, place, and time. She appears well-developed and well-nourished. No distress.  HENT:  Head: Normocephalic.  Eyes: Pupils are equal, round, and reactive to light.  Neck: Neck supple.  Respiratory: Effort normal.  GI: Soft.  Musculoskeletal: Normal range of motion.  Neurological: She is alert and oriented to person, place, and time.  Skin: Skin is warm. She is not diaphoretic.  Psychiatric: Her behavior is normal.   Fetal Tracing: Baseline: 135 bpm  Variability: Moderate  Accelerations: 15x15 Decelerations: None Toco: None  MAU Course  Procedures  None  MDM Discussed patient with Dr. Ambrose MantleHenley  Azithromycin 1 gram PO in MAU   Will treat partner; RX given. Allergies, DOB confirmed.   Assessment and Plan   A:  Decreased fetal movement Reactive NST Chlamydia positive   P:  Discharge home in stable condition Kick counts Partner was given RX for treatment. No intercourse for 7 days following treatment.  Condoms always Keep next appointment with Dr. Cheryln ManlyHenley   Susan Irene Rasch, NP 03/09/2014 2:39 PM

## 2014-03-09 NOTE — MAU Note (Signed)
Hx of panic attacks and anxiety; pt received a call yesterday afternoon to notify her that she tested + for chlamydia and pt has been upset ever since; wishes to be retested and believes test was in error; c/o decreased fetal movement but when efm applied + FM was observed and heard;

## 2014-03-09 NOTE — MAU Note (Signed)
Pt presents complaining of panic attacks and anxiety since getting a positive STD check this Monday. States she wants to be rechecked. Reports decreased fetal movement today. Denies vaginal bleeding or discharge. States she is having irregular contractions.

## 2014-03-09 NOTE — Discharge Instructions (Signed)
Chlamydia Chlamydia is an infection. It is spread from one person to another person during sexual contact. This infection can be in the cervix, urine tube (urethra), throat, or bottom (rectum). This infection needs treatment. HOME CARE   Take your medicines (antibiotics) as told. Finish them even if you start to feel better.  Only take medicine as told by your doctor.  Tell your sex partner(s) that you have chlamydia. They must also be treated.  Do not have sex until your doctor says it is okay.  Rest.  Eat healthy. Drink enough fluids to keep your pee (urine) clear or pale yellow.  Keep all doctor visits as told. GET HELP IF:  You have pain when you pee.  You have belly pain.  You have vaginal discharge.  You have pain during sex.  You have bleeding between periods and after sex.  You have a fever. GET HELP RIGHT AWAY IF:   You feel sick to your stomach (nauseous) or you throw up (vomit).  You sweat much more than normal (diaphoresis).  You have trouble swallowing. MAKE SURE YOU:   Understand these instructions.  Will watch your condition.  Will get help right away if you are not doing well or get worse. Document Released: 10/17/2007 Document Revised: 05/24/2013 Document Reviewed: 09/14/2012 Exit[employer] Patient Information 2015 [employer]. This information is not intended to replace advice given to you by your health care provider. Make sure you discuss any questions you have with your health care provider.  

## 2014-04-13 LAB — OB RESULTS CONSOLE GBS: GBS: POSITIVE

## 2014-04-13 LAB — OB RESULTS CONSOLE GC/CHLAMYDIA
CHLAMYDIA, DNA PROBE: NEGATIVE
Gonorrhea: NEGATIVE

## 2014-04-15 ENCOUNTER — Encounter (HOSPITAL_COMMUNITY): Payer: Self-pay

## 2014-04-15 ENCOUNTER — Inpatient Hospital Stay (HOSPITAL_COMMUNITY)
Admission: AD | Admit: 2014-04-15 | Discharge: 2014-04-15 | Disposition: A | Discharge status: Home / Self Care | Payer: Medicaid Other | Source: Ambulatory Visit | Attending: Obstetrics and Gynecology | Admitting: Obstetrics and Gynecology

## 2014-04-15 DIAGNOSIS — O368130 Decreased fetal movements, third trimester, not applicable or unspecified: Secondary | ICD-10-CM | POA: Insufficient documentation

## 2014-04-15 DIAGNOSIS — Z3A36 36 weeks gestation of pregnancy: Secondary | ICD-10-CM | POA: Diagnosis not present

## 2014-04-15 LAB — URINALYSIS, ROUTINE W REFLEX MICROSCOPIC
BILIRUBIN URINE: NEGATIVE
Glucose, UA: NEGATIVE mg/dL
HGB URINE DIPSTICK: NEGATIVE
Leukocytes, UA: NEGATIVE
NITRITE: NEGATIVE
PROTEIN: NEGATIVE mg/dL
Specific Gravity, Urine: 1.025 (ref 1.005–1.030)
UROBILINOGEN UA: 0.2 mg/dL (ref 0.0–1.0)
pH: 6 (ref 5.0–8.0)

## 2014-04-15 NOTE — Discharge Instructions (Signed)
Braxton Hicks Contractions °Contractions of the uterus can occur throughout pregnancy. Contractions are not always a sign that you are in labor.  °WHAT ARE BRAXTON HICKS CONTRACTIONS?  °Contractions that occur before labor are called Braxton Hicks contractions, or false labor. Toward the end of pregnancy (32-34 weeks), these contractions can develop more often and may become more forceful. This is not true labor because these contractions do not result in opening (dilatation) and thinning of the cervix. They are sometimes difficult to tell apart from true labor because these contractions can be forceful and people have different pain tolerances. You should not feel embarrassed if you go to the hospital with false labor. Sometimes, the only way to tell if you are in true labor is for your health care provider to look for changes in the cervix. °If there are no prenatal problems or other health problems associated with the pregnancy, it is completely safe to be sent home with false labor and await the onset of true labor. °HOW CAN YOU TELL THE DIFFERENCE BETWEEN TRUE AND FALSE LABOR? °False Labor °· The contractions of false labor are usually shorter and not as hard as those of true labor.   °· The contractions are usually irregular.   °· The contractions are often felt in the front of the lower abdomen and in the groin.   °· The contractions may go away when you walk around or change positions while lying down.   °· The contractions get weaker and are shorter lasting as time goes on.   °· The contractions do not usually become progressively stronger, regular, and closer together as with true labor.   °True Labor °· Contractions in true labor last 30-70 seconds, become very regular, usually become more intense, and increase in frequency.   °· The contractions do not go away with walking.   °· The discomfort is usually felt in the top of the uterus and spreads to the lower abdomen and low back.   °· True labor can be  determined by your health care provider with an exam. This will show that the cervix is dilating and getting thinner.   °WHAT TO REMEMBER °· Keep up with your usual exercises and follow other instructions given by your health care provider.   °· Take medicines as directed by your health care provider.   °· Keep your regular prenatal appointments.   °· Eat and drink lightly if you think you are going into labor.   °· If Braxton Hicks contractions are making you uncomfortable:   °¨ Change your position from lying down or resting to walking, or from walking to resting.   °¨ Sit and rest in a tub of warm water.   °¨ Drink 2-3 glasses of water. Dehydration may cause these contractions.   °¨ Do slow and deep breathing several times an hour.   °WHEN SHOULD I SEEK IMMEDIATE MEDICAL CARE? °Seek immediate medical care if: °· Your contractions become stronger, more regular, and closer together.   °· You have fluid leaking or gushing from your vagina.   °· You have a fever.   °· You pass blood-tinged mucus.   °· You have vaginal bleeding.   °· You have continuous abdominal pain.   °· You have low back pain that you never had before.   °· You feel your baby's head pushing down and causing pelvic pressure.   °· Your baby is not moving as much as it used to.   °Document Released: 01/07/2005 Document Revised: 01/12/2013 Document Reviewed: 10/19/2012 °[employer]® Patient Information ©2015 [employer]. This information is not intended to replace advice given to you by your health care   provider. Make sure you discuss any questions you have with your health care provider. °Fetal Movement Counts °Patient Name: __________________________________________________ Patient Due Date: ____________________ °Performing a fetal movement count is highly recommended in high-risk pregnancies, but it is good for every pregnant woman to do. Your health care provider may ask you to start counting fetal movements at 28 weeks of the pregnancy. Fetal  movements often increase: °· After eating a full meal. °· After physical activity. °· After eating or drinking something sweet or cold. °· At rest. °Pay attention to when you feel the baby is most active. This will help you notice a pattern of your baby's sleep and wake cycles and what factors contribute to an increase in fetal movement. It is important to perform a fetal movement count at the same time each day when your baby is normally most active.  °HOW TO COUNT FETAL MOVEMENTS °· Find a quiet and comfortable area to sit or lie down on your left side. Lying on your left side provides the best blood and oxygen circulation to your baby. °· Write down the day and time on a sheet of paper or in a journal. °· Start counting kicks, flutters, swishes, rolls, or jabs in a 2-hour period. You should feel at least 10 movements within 2 hours. °· If you do not feel 10 movements in 2 hours, wait 2-3 hours and count again. Look for a change in the pattern or not enough counts in 2 hours. °SEEK MEDICAL CARE IF: °· You feel less than 10 counts in 2 hours, tried twice. °· There is no movement in over an hour. °· The pattern is changing or taking longer each day to reach 10 counts in 2 hours. °· You feel the baby is not moving as he or she usually does. °Date: ____________ Movements: ____________ Start time: ____________ Finish time: ____________  °Date: ____________ Movements: ____________ Start time: ____________ Finish time: ____________ °Date: ____________ Movements: ____________ Start time: ____________ Finish time: ____________ °Date: ____________ Movements: ____________ Start time: ____________ Finish time: ____________ °Date: ____________ Movements: ____________ Start time: ____________ Finish time: ____________ °Date: ____________ Movements: ____________ Start time: ____________ Finish time: ____________ °Date: ____________ Movements: ____________ Start time: ____________ Finish time: ____________ °Date: ____________  Movements: ____________ Start time: ____________ Finish time: ____________  °Date: ____________ Movements: ____________ Start time: ____________ Finish time: ____________ °Date: ____________ Movements: ____________ Start time: ____________ Finish time: ____________ °Date: ____________ Movements: ____________ Start time: ____________ Finish time: ____________ °Date: ____________ Movements: ____________ Start time: ____________ Finish time: ____________ °Date: ____________ Movements: ____________ Start time: ____________ Finish time: ____________ °Date: ____________ Movements: ____________ Start time: ____________ Finish time: ____________ °Date: ____________ Movements: ____________ Start time: ____________ Finish time: ____________  °Date: ____________ Movements: ____________ Start time: ____________ Finish time: ____________ °Date: ____________ Movements: ____________ Start time: ____________ Finish time: ____________ °Date: ____________ Movements: ____________ Start time: ____________ Finish time: ____________ °Date: ____________ Movements: ____________ Start time: ____________ Finish time: ____________ °Date: ____________ Movements: ____________ Start time: ____________ Finish time: ____________ °Date: ____________ Movements: ____________ Start time: ____________ Finish time: ____________ °Date: ____________ Movements: ____________ Start time: ____________ Finish time: ____________  °Date: ____________ Movements: ____________ Start time: ____________ Finish time: ____________ °Date: ____________ Movements: ____________ Start time: ____________ Finish time: ____________ °Date: ____________ Movements: ____________ Start time: ____________ Finish time: ____________ °Date: ____________ Movements: ____________ Start time: ____________ Finish time: ____________ °Date: ____________ Movements: ____________ Start time: ____________ Finish time: ____________ °Date: ____________ Movements: ____________ Start time:  ____________ Finish time: ____________ °Date: ____________ Movements: ____________   Start time: ____________ Finish time: ____________  °Date: ____________ Movements: ____________ Start time: ____________ Finish time: ____________ °Date: ____________ Movements: ____________ Start time: ____________ Finish time: ____________ °Date: ____________ Movements: ____________ Start time: ____________ Finish time: ____________ °Date: ____________ Movements: ____________ Start time: ____________ Finish time: ____________ °Date: ____________ Movements: ____________ Start time: ____________ Finish time: ____________ °Date: ____________ Movements: ____________ Start time: ____________ Finish time: ____________ °Date: ____________ Movements: ____________ Start time: ____________ Finish time: ____________  °Date: ____________ Movements: ____________ Start time: ____________ Finish time: ____________ °Date: ____________ Movements: ____________ Start time: ____________ Finish time: ____________ °Date: ____________ Movements: ____________ Start time: ____________ Finish time: ____________ °Date: ____________ Movements: ____________ Start time: ____________ Finish time: ____________ °Date: ____________ Movements: ____________ Start time: ____________ Finish time: ____________ °Date: ____________ Movements: ____________ Start time: ____________ Finish time: ____________ °Date: ____________ Movements: ____________ Start time: ____________ Finish time: ____________  °Date: ____________ Movements: ____________ Start time: ____________ Finish time: ____________ °Date: ____________ Movements: ____________ Start time: ____________ Finish time: ____________ °Date: ____________ Movements: ____________ Start time: ____________ Finish time: ____________ °Date: ____________ Movements: ____________ Start time: ____________ Finish time: ____________ °Date: ____________ Movements: ____________ Start time: ____________ Finish time: ____________ °Date:  ____________ Movements: ____________ Start time: ____________ Finish time: ____________ °Date: ____________ Movements: ____________ Start time: ____________ Finish time: ____________  °Date: ____________ Movements: ____________ Start time: ____________ Finish time: ____________ °Date: ____________ Movements: ____________ Start time: ____________ Finish time: ____________ °Date: ____________ Movements: ____________ Start time: ____________ Finish time: ____________ °Date: ____________ Movements: ____________ Start time: ____________ Finish time: ____________ °Date: ____________ Movements: ____________ Start time: ____________ Finish time: ____________ °Date: ____________ Movements: ____________ Start time: ____________ Finish time: ____________ °Document Released: 02/06/2006 Document Revised: 05/24/2013 Document Reviewed: 11/04/2011 °[employer]® Patient Information ©2015 [employer]. This information is not intended to replace advice given to you by your health care provider. Make sure you discuss any questions you have with your health care provider. ° °

## 2014-04-15 NOTE — MAU Note (Signed)
Lost my mucous plug this am. Cont to have some mucousy d/c. Having some contractions. Had 8 in . Baby not moving as much as usual

## 2014-04-17 NOTE — Progress Notes (Signed)
FHT from 3-25 reviewed.  Reactive NST, irreg ctx.

## 2014-05-05 ENCOUNTER — Telehealth (HOSPITAL_COMMUNITY): Payer: Self-pay | Admitting: *Deleted

## 2014-05-05 NOTE — Telephone Encounter (Signed)
Preadmission screen  

## 2014-05-10 ENCOUNTER — Encounter (HOSPITAL_COMMUNITY): Payer: Self-pay

## 2014-05-10 DIAGNOSIS — Z3493 Encounter for supervision of normal pregnancy, unspecified, third trimester: Secondary | ICD-10-CM

## 2014-05-10 HISTORY — DX: Encounter for supervision of normal pregnancy, unspecified, third trimester: Z34.93

## 2014-05-10 NOTE — H&P (Signed)
Susan Hart is a 29 y.o. female G2P1001 at 39+ for IOL given term and favorable cervix.  Pregnancy complicated by +GBBS.  Nl anat on US. Chl in early pregnancy - TOC neg  D/w pt r/b/a of IOL, voices understanding d/w pt POC and rupure after PCN.  +FM, no LOF, no VB, occ ctx.  Recent split with FOB, no DV   Maternal Medical History:  Contractions: Frequency: irregular.    Fetal activity: Perceived fetal activity is normal.      OB History    Gravida Para Term Preterm AB TAB SAB Ectopic Multiple Living   2 1 1       1     G2P1 G1 SVD 7#2, female 7#2 G2 present No abn pap STD = Chl x2, trich  Past Medical History  Diagnosis Date  . Anxiety     just since being pregnant, 2015  . Normal fetus during pregnancy in third trimester 05/10/2014  seasonal allergies  Past Surgical History  Procedure Laterality Date  . No past surgeries     Family History: family history is negative for Alcohol abuse, Arthritis, Asthma, Birth defects, Cancer, COPD, Depression, Diabetes, Early death, Drug abuse, Hearing loss, Heart disease, Hyperlipidemia, Hypertension, Kidney disease, Learning disabilities, Mental illness, Mental retardation, Miscarriages / Stillbirths, Stroke, Vision loss, and Varicose Veins. She was adopted. Social History:  reports that she has never smoked. She does not have any smokeless tobacco history on file. She reports that she does not drink alcohol or use illicit drugs.beautician, in/oue relationship Meds PNV, hydroxyzine All Diflucan   Prenatal Transfer Tool  Maternal Diabetes: No Genetic Screening: Normal Maternal Ultrasounds/Referrals: Normal Fetal Ultrasounds or other Referrals:  None Maternal Substance Abuse:  No Significant Maternal Medications:  None Significant Maternal Lab Results:  Lab values include: Group B Strep positive Other Comments:  None  Review of Systems  Constitutional: Negative.   HENT: Negative.   Eyes: Negative.   Respiratory: Negative.    Cardiovascular: Negative.   Gastrointestinal: Negative.   Genitourinary: Negative.   Musculoskeletal: Negative.   Skin: Negative.   Neurological: Negative.   Psychiatric/Behavioral: Negative.       Last menstrual period 08/08/2013. Maternal Exam:  Abdomen: Patient reports no abdominal tenderness. Fundal height is appropriate for gestation.   Estimated fetal weight is 7.5#-8#.   Fetal presentation: vertex  Introitus: Normal vulva. Normal vagina.  Pelvis: adequate for delivery.   Cervix: Cervix evaluated by digital exam.     Physical Exam  Constitutional: She is oriented to person, place, and time. She appears well-developed and well-nourished.  HENT:  Head: Normocephalic and atraumatic.  Cardiovascular: Normal rate and regular rhythm.   Respiratory: Effort normal and breath sounds normal. No respiratory distress. She has no wheezes.  GI: Soft. Bowel sounds are normal. She exhibits no distension. There is no tenderness.  Musculoskeletal: Normal range of motion.  Neurological: She is alert and oriented to person, place, and time.  Skin: Skin is warm and dry.  Psychiatric: She has a normal mood and affect. Her behavior is normal.    Prenatal labs: ABO, Rh: O/Positive/-- (10/27 0000) Antibody: Negative (10/27 0000) Rubella: Immune (10/27 0000) RPR: Nonreactive (10/27 0000)  HBsAg: Negative (10/27 0000)  HIV: Non-reactive (10/27 0000)  GBS: Positive (03/23 0000)  Hgb 13.3/ Plt 270K/ Ur Cx neg/chl + toc neg/gc / hgb electro WNL  Dates cw eearly US Nl anat, ant plac, female glucola 62 Tdap 3/16, Flu 10/15  Assessment/Plan: 29yo G2P1 at 39+ for  IOL gbbs + - PCN for prophylaxis AROM after PCN Augment with pitocin\ Expect SVD  Bovard-Stuckert, Hayslee Casebolt 05/10/2014, 9:12 PM

## 2014-05-11 ENCOUNTER — Encounter (HOSPITAL_COMMUNITY): Payer: Self-pay

## 2014-05-11 ENCOUNTER — Inpatient Hospital Stay (HOSPITAL_COMMUNITY): Payer: Medicaid Other | Admitting: Anesthesiology

## 2014-05-11 ENCOUNTER — Inpatient Hospital Stay (HOSPITAL_COMMUNITY)
Admission: RE | Admit: 2014-05-11 | Discharge: 2014-05-13 | DRG: 775 | Disposition: A | Discharge status: Home / Self Care | Payer: Medicaid Other | Source: Ambulatory Visit | Attending: Obstetrics and Gynecology | Admitting: Obstetrics and Gynecology

## 2014-05-11 VITALS — BP 125/68 | HR 61 | Temp 98.7°F | Resp 20 | Ht 70.0 in | Wt 205.0 lb

## 2014-05-11 DIAGNOSIS — Z3A39 39 weeks gestation of pregnancy: Secondary | ICD-10-CM | POA: Diagnosis present

## 2014-05-11 DIAGNOSIS — A749 Chlamydial infection, unspecified: Secondary | ICD-10-CM

## 2014-05-11 DIAGNOSIS — O9989 Other specified diseases and conditions complicating pregnancy, childbirth and the puerperium: Principal | ICD-10-CM | POA: Diagnosis present

## 2014-05-11 DIAGNOSIS — Z3480 Encounter for supervision of other normal pregnancy, unspecified trimester: Secondary | ICD-10-CM

## 2014-05-11 DIAGNOSIS — O98819 Other maternal infectious and parasitic diseases complicating pregnancy, unspecified trimester: Secondary | ICD-10-CM

## 2014-05-11 DIAGNOSIS — O99824 Streptococcus B carrier state complicating childbirth: Secondary | ICD-10-CM | POA: Diagnosis present

## 2014-05-11 DIAGNOSIS — Z3493 Encounter for supervision of normal pregnancy, unspecified, third trimester: Secondary | ICD-10-CM

## 2014-05-11 HISTORY — DX: Encounter for supervision of normal pregnancy, unspecified, third trimester: Z34.93

## 2014-05-11 LAB — CBC
HEMATOCRIT: 36.2 % (ref 36.0–46.0)
Hemoglobin: 12.2 g/dL (ref 12.0–15.0)
MCH: 25.5 pg — AB (ref 26.0–34.0)
MCHC: 33.7 g/dL (ref 30.0–36.0)
MCV: 75.6 fL — ABNORMAL LOW (ref 78.0–100.0)
Platelets: 251 10*3/uL (ref 150–400)
RBC: 4.79 MIL/uL (ref 3.87–5.11)
RDW: 15.7 % — ABNORMAL HIGH (ref 11.5–15.5)
WBC: 5.5 10*3/uL (ref 4.0–10.5)

## 2014-05-11 LAB — TYPE AND SCREEN
ABO/RH(D): O POS
Antibody Screen: NEGATIVE

## 2014-05-11 LAB — RPR: RPR Ser Ql: NONREACTIVE

## 2014-05-11 LAB — ABO/RH: ABO/RH(D): O POS

## 2014-05-11 MED ORDER — ONDANSETRON HCL 4 MG/2ML IJ SOLN
4.0000 mg | Freq: Four times a day (QID) | INTRAMUSCULAR | Status: DC | PRN
  Administered 2014-05-11 – 2014-05-12 (×2): 4 mg via INTRAVENOUS
  Filled 2014-05-11 (×2): qty 2

## 2014-05-11 MED ORDER — OXYCODONE-ACETAMINOPHEN 5-325 MG PO TABS: 1.0000 | ORAL_TABLET | ORAL | Status: DC | PRN

## 2014-05-11 MED ORDER — OXYTOCIN 40 UNITS IN LACTATED RINGERS INFUSION - SIMPLE MED
62.5000 mL/h | INTRAVENOUS | Status: DC
  Administered 2014-05-12: 62.5 mL/h via INTRAVENOUS
  Filled 2014-05-11 (×2): qty 1000

## 2014-05-11 MED ORDER — OXYCODONE-ACETAMINOPHEN 5-325 MG PO TABS: 2.0000 | ORAL_TABLET | ORAL | Status: DC | PRN

## 2014-05-11 MED ORDER — LACTATED RINGERS IV SOLN
500.0000 mL | Freq: Once | INTRAVENOUS | Status: AC
  Administered 2014-05-11: 500 mL via INTRAVENOUS

## 2014-05-11 MED ORDER — OXYTOCIN 40 UNITS IN LACTATED RINGERS INFUSION - SIMPLE MED
1.0000 m[IU]/min | INTRAVENOUS | Status: DC
  Administered 2014-05-11: 22 m[IU]/min via INTRAVENOUS
  Administered 2014-05-11: 2 m[IU]/min via INTRAVENOUS
  Administered 2014-05-11: 24 m[IU]/min via INTRAVENOUS
  Administered 2014-05-11: 26 m[IU]/min via INTRAVENOUS
  Administered 2014-05-12: 40 [IU] via INTRAVENOUS

## 2014-05-11 MED ORDER — DIPHENHYDRAMINE HCL 50 MG/ML IJ SOLN
12.5000 mg | INTRAMUSCULAR | Status: DC | PRN
  Administered 2014-05-11: 12.5 mg via INTRAVENOUS
  Filled 2014-05-11: qty 1

## 2014-05-11 MED ORDER — CITRIC ACID-SODIUM CITRATE 334-500 MG/5ML PO SOLN: 30.0000 mL | ORAL | Status: DC | PRN

## 2014-05-11 MED ORDER — LACTATED RINGERS IV SOLN
500.0000 mL | INTRAVENOUS | Status: DC | PRN
  Administered 2014-05-11: 500 mL via INTRAVENOUS

## 2014-05-11 MED ORDER — FENTANYL 2.5 MCG/ML BUPIVACAINE 1/10 % EPIDURAL INFUSION (WH - ANES)
14.0000 mL/h | INTRAMUSCULAR | Status: DC | PRN
  Administered 2014-05-11 (×2): 14 mL/h via EPIDURAL
  Filled 2014-05-11 (×2): qty 125

## 2014-05-11 MED ORDER — TERBUTALINE SULFATE 1 MG/ML IJ SOLN: 0.2500 mg | Freq: Once | INTRAMUSCULAR | Status: AC | PRN

## 2014-05-11 MED ORDER — PHENYLEPHRINE 40 MCG/ML (10ML) SYRINGE FOR IV PUSH (FOR BLOOD PRESSURE SUPPORT)
80.0000 ug | PREFILLED_SYRINGE | INTRAVENOUS | Status: DC | PRN
  Filled 2014-05-11: qty 2

## 2014-05-11 MED ORDER — BUTORPHANOL TARTRATE 1 MG/ML IJ SOLN: 1.0000 mg | INTRAMUSCULAR | Status: DC | PRN

## 2014-05-11 MED ORDER — LIDOCAINE HCL (PF) 1 % IJ SOLN
INTRAMUSCULAR | Status: DC | PRN
  Administered 2014-05-11: 10 mL

## 2014-05-11 MED ORDER — FENTANYL 2.5 MCG/ML BUPIVACAINE 1/10 % EPIDURAL INFUSION (WH - ANES)
INTRAMUSCULAR | Status: DC | PRN
  Administered 2014-05-11: 14 mL/h via EPIDURAL

## 2014-05-11 MED ORDER — DEXTROSE 5 % IV SOLN
5.0000 10*6.[IU] | Freq: Once | INTRAVENOUS | Status: AC
  Administered 2014-05-11: 5 10*6.[IU] via INTRAVENOUS
  Filled 2014-05-11: qty 5

## 2014-05-11 MED ORDER — DEXTROSE 5 % IV SOLN
2.5000 10*6.[IU] | INTRAVENOUS | Status: DC
  Administered 2014-05-11 – 2014-05-12 (×4): 2.5 10*6.[IU] via INTRAVENOUS
  Filled 2014-05-11 (×8): qty 2.5

## 2014-05-11 MED ORDER — PHENYLEPHRINE 40 MCG/ML (10ML) SYRINGE FOR IV PUSH (FOR BLOOD PRESSURE SUPPORT)
80.0000 ug | PREFILLED_SYRINGE | INTRAVENOUS | Status: DC | PRN
  Filled 2014-05-11: qty 2
  Filled 2014-05-11: qty 20

## 2014-05-11 MED ORDER — EPHEDRINE 5 MG/ML INJ
10.0000 mg | INTRAVENOUS | Status: DC | PRN
  Filled 2014-05-11: qty 2

## 2014-05-11 MED ORDER — LIDOCAINE HCL (PF) 1 % IJ SOLN
30.0000 mL | INTRAMUSCULAR | Status: DC | PRN
  Filled 2014-05-11: qty 30

## 2014-05-11 MED ORDER — LACTATED RINGERS IV SOLN
INTRAVENOUS | Status: DC
  Administered 2014-05-11 (×3): via INTRAVENOUS

## 2014-05-11 MED ORDER — ACETAMINOPHEN 325 MG PO TABS: 650.0000 mg | ORAL_TABLET | ORAL | Status: DC | PRN

## 2014-05-11 MED ORDER — OXYTOCIN BOLUS FROM INFUSION: 500.0000 mL | INTRAVENOUS | Status: DC

## 2014-05-11 NOTE — Anesthesia Preprocedure Evaluation (Signed)
Anesthesia Evaluation  Patient identified by MRN, date of birth, ID band Patient awake and Patient confused    Reviewed: Allergy & Precautions, H&P , NPO status , Patient's Chart, lab work & pertinent test results  Airway Mallampati: II   Neck ROM: full    Dental  (+) Teeth Intact   Pulmonary  breath sounds clear to auscultation  Pulmonary exam normal       Cardiovascular Exercise Tolerance: Good Rhythm:regular Rate:Normal     Neuro/Psych    GI/Hepatic   Endo/Other    Renal/GU      Musculoskeletal   Abdominal Normal abdominal exam  (+)   Peds  Hematology   Anesthesia Other Findings   Reproductive/Obstetrics (+) Pregnancy                             Anesthesia Physical Anesthesia Plan  ASA: II  Anesthesia Plan: Epidural   Post-op Pain Management:    Induction:   Airway Management Planned:   Additional Equipment:   Intra-op Plan:   Post-operative Plan:   Informed Consent: I have reviewed the patients History and Physical, chart, labs and discussed the procedure including the risks, benefits and alternatives for the proposed anesthesia with the patient or authorized representative who has indicated his/her understanding and acceptance.     Plan Discussed with:   Anesthesia Plan Comments:         Anesthesia Quick Evaluation  

## 2014-05-11 NOTE — Anesthesia Procedure Notes (Signed)
Epidural  Start time: 05/11/2014 1:30 PM End time: 05/11/2014 1:43 PM  Staffing Anesthesiologist: Sebastian AcheMANNY, Nyela Cortinas Performed by: anesthesiologist   Preanesthetic Checklist Completed: patient identified, site marked, surgical consent, pre-op evaluation, timeout performed, IV checked, risks and benefits discussed and monitors and equipment checked  Epidural Patient position: sitting Prep: site prepped and draped and DuraPrep Patient monitoring: heart rate, continuous pulse ox and blood pressure Approach: midline Location: L2-L3 Injection technique: LOR air  Needle:  Needle type: Tuohy  Needle gauge: 17 G Needle length: 9 cm and 9 Needle insertion depth: 7 cm Catheter type: closed end flexible Catheter size: 19 Gauge Test dose: negative and 1.5% lidocaine with Epi 1:200 K  Assessment Events: blood not aspirated, injection not painful, no injection resistance, negative IV test and no paresthesia  Additional Notes   Patient tolerated the insertion well without complications.Reason for block:procedure for pain

## 2014-05-11 NOTE — Progress Notes (Signed)
Patient ID: Susan Hart, female   DOB: 08/25/1985, 29 y.o.   MRN: [id number]  Reviewed H&P, no changes.  No c/o's  AFVSS gen NAD FHTs 135, good var, category 1 toco q 10  SVE 1.2/30/-2, vtx  Continue plan for IOL Pitocin, AROM after PCN

## 2014-05-11 NOTE — Progress Notes (Signed)
Patient ID: Susan Hart, female   DOB: 1985-08-26, 29 y.o.   MRN: [id number]  No c/o's.  Feeling some ctx  AFVSS gen NAD FHTs 140's, good var, category 1 toco Q 3-264min  SVE 2.3cm/70/-2; vtx  AROM for clear fluid, w/o diff/comp  Continue current mgmt

## 2014-05-12 ENCOUNTER — Encounter (HOSPITAL_COMMUNITY): Payer: Self-pay

## 2014-05-12 MED ORDER — SENNOSIDES-DOCUSATE SODIUM 8.6-50 MG PO TABS
2.0000 | ORAL_TABLET | ORAL | Status: DC
  Administered 2014-05-12: 2 via ORAL
  Filled 2014-05-12: qty 2

## 2014-05-12 MED ORDER — OXYCODONE-ACETAMINOPHEN 5-325 MG PO TABS: 1.0000 | ORAL_TABLET | ORAL | Status: DC | PRN

## 2014-05-12 MED ORDER — LACTATED RINGERS IV SOLN: INTRAVENOUS | Status: DC

## 2014-05-12 MED ORDER — ONDANSETRON HCL 4 MG PO TABS: 4.0000 mg | ORAL_TABLET | ORAL | Status: DC | PRN

## 2014-05-12 MED ORDER — SIMETHICONE 80 MG PO CHEW
80.0000 mg | CHEWABLE_TABLET | ORAL | Status: DC | PRN
Start: 2014-05-12 — End: 2014-05-13

## 2014-05-12 MED ORDER — WITCH HAZEL-GLYCERIN EX PADS: 1.0000 "application " | MEDICATED_PAD | CUTANEOUS | Status: DC | PRN

## 2014-05-12 MED ORDER — LANOLIN HYDROUS EX OINT
TOPICAL_OINTMENT | CUTANEOUS | Status: DC | PRN
Start: 2014-05-12 — End: 2014-05-13

## 2014-05-12 MED ORDER — ONDANSETRON HCL 4 MG/2ML IJ SOLN
4.0000 mg | INTRAMUSCULAR | Status: DC | PRN
Start: 2014-05-12 — End: 2014-05-13

## 2014-05-12 MED ORDER — BENZOCAINE-MENTHOL 20-0.5 % EX AERO
1.0000 "application " | INHALATION_SPRAY | CUTANEOUS | Status: DC | PRN
  Filled 2014-05-12: qty 56

## 2014-05-12 MED ORDER — ACETAMINOPHEN 325 MG PO TABS: 650.0000 mg | ORAL_TABLET | ORAL | Status: DC | PRN

## 2014-05-12 MED ORDER — IBUPROFEN 600 MG PO TABS
600.0000 mg | ORAL_TABLET | Freq: Four times a day (QID) | ORAL | Status: DC
  Administered 2014-05-12 – 2014-05-13 (×6): 600 mg via ORAL
  Filled 2014-05-12 (×6): qty 1

## 2014-05-12 MED ORDER — ZOLPIDEM TARTRATE 5 MG PO TABS: 5.0000 mg | ORAL_TABLET | Freq: Every evening | ORAL | Status: DC | PRN

## 2014-05-12 MED ORDER — DIPHENHYDRAMINE HCL 25 MG PO CAPS: 25.0000 mg | ORAL_CAPSULE | Freq: Four times a day (QID) | ORAL | Status: DC | PRN

## 2014-05-12 MED ORDER — PRENATAL MULTIVITAMIN CH
1.0000 | ORAL_TABLET | Freq: Every day | ORAL | Status: DC
  Administered 2014-05-12 – 2014-05-13 (×2): 1 via ORAL
  Filled 2014-05-12 (×2): qty 1

## 2014-05-12 MED ORDER — OXYCODONE-ACETAMINOPHEN 5-325 MG PO TABS: 2.0000 | ORAL_TABLET | ORAL | Status: DC | PRN

## 2014-05-12 MED ORDER — DIBUCAINE 1 % RE OINT
1.0000 "application " | TOPICAL_OINTMENT | RECTAL | Status: DC | PRN
  Filled 2014-05-12: qty 28

## 2014-05-12 NOTE — Progress Notes (Signed)
Post Partum Day 0 Subjective: no complaints, up ad lib, tolerating PO and nl lochia, pain controlled  Objective: Blood pressure 113/56, pulse 69, temperature 98.4 F (36.9 C), temperature source Oral, resp. rate 18, height 5\' 10"  (1.778 m), weight 92.987 kg (205 lb), last menstrual period 08/08/2013, SpO2 99 %, unknown if currently breastfeeding.  Physical Exam:  General: alert and no distress Lochia: appropriate Uterine Fundus: firm   Recent Labs  05/11/14 0820  HGB 12.2  HCT 36.2    Assessment/Plan: Breastfeeding and Lactation consult.   Routine PP care.  Doing well.     LOS: 1 day   Bovard-Stuckert, Sinda Leedom 05/12/2014, 7:29 AM

## 2014-05-12 NOTE — Lactation Note (Signed)
This note was copied from the chart of Susan Starleen ArmsNikia Queener. Lactation Consultation Note; Lactation brochure given with review of basic's. Mother states that she breastfed and formula fed her first child for 3 months. Mother states that she is seeing colostrum when she hand expresses. Reviewed proper hand expression. Mother states that infant fed for 10 mins the last feeding. Mother to breastfeed infant 8-12 times in 24 hours as well as with cues. Reviewed baby and me book on proper latch. Mother advised to page for feeding to be observed. Mother is aware of available LC services and community support.  Patient Name: Susan Starleen Armsikia Kings UJWJX'BToday's Date: 05/12/2014 Reason for consult: Initial assessment   Maternal Data Has patient been taught Hand Expression?: Yes Does the patient have breastfeeding experience prior to this delivery?: Yes  Feeding Feeding Type: Breast Fed Length of feed: 10 min  LATCH Score/Interventions                      Lactation Tools Discussed/Used     Consult Status Consult Status: Follow-up Date: 05/12/14 Follow-up type: In-patient    Stevan BornKendrick, Khiley Lieser [facility] 05/12/2014, 3:08 PM

## 2014-05-12 NOTE — Progress Notes (Signed)
UR chart review completed.  

## 2014-05-12 NOTE — Anesthesia Postprocedure Evaluation (Signed)
  Anesthesia Post-op Note  Patient: Susan Hart  Procedure(s) Performed: * No procedures listed *  Patient Location: Mother/Baby  Anesthesia Type:Epidural  Level of Consciousness: awake, alert , oriented and patient cooperative  Airway and Oxygen Therapy: Patient Spontanous Breathing  Post-op Pain: none  Post-op Assessment: Post-op Vital signs reviewed, Patient's Cardiovascular Status Stable, Respiratory Function Stable, Patent Airway, No headache, No backache, No residual numbness and No residual motor weakness  Post-op Vital Signs: Reviewed and stable  Last Vitals:  Filed Vitals:   05/12/14 0430  BP: 113/56  Pulse: 69  Temp: 36.9 C  Resp: 18    Complications: No apparent anesthesia complications

## 2014-05-12 NOTE — Progress Notes (Addendum)
Patient ID: Susan Hart, female   DOB: 1985-05-08, 29 y.o.   MRN: [id number]  Some pressure.  Comfortable with epidural  AFVSS gen NAD FHTs 140 - 150's, good var, category 1 toco Q 2-766min, some coupling  SVE 10/100/+1 Will push  Expect SVD soon

## 2014-05-12 NOTE — Progress Notes (Signed)
Patient ID : Susan Hart, female   DOB: 07-29-1985, 29 y.o.   MRN: [id number]  Late entry/ first note in wrong chart (05/11/14 17:46)  Comfortable with epidural AFVSS gen NAD FHTs 135, good var, category 1 toco q 2min  SVD 4.5/80/-2  Continue IOL

## 2014-05-12 NOTE — Plan of Care (Signed)
Problem: Discharge Progression Outcomes Goal: Voids prior to transfer Outcome: Not Met (add Reason) Patient had a foley cath, was removed prior to delivery

## 2014-05-13 LAB — CBC
HCT: 32 % — ABNORMAL LOW (ref 36.0–46.0)
Hemoglobin: 10.6 g/dL — ABNORMAL LOW (ref 12.0–15.0)
MCH: 25.3 pg — AB (ref 26.0–34.0)
MCHC: 33.1 g/dL (ref 30.0–36.0)
MCV: 76.4 fL — ABNORMAL LOW (ref 78.0–100.0)
PLATELETS: 196 10*3/uL (ref 150–400)
RBC: 4.19 MIL/uL (ref 3.87–5.11)
RDW: 15.8 % — ABNORMAL HIGH (ref 11.5–15.5)
WBC: 12.6 10*3/uL — ABNORMAL HIGH (ref 4.0–10.5)

## 2014-05-13 MED ORDER — OXYCODONE-ACETAMINOPHEN 5-325 MG PO TABS: 1.0000 | ORAL_TABLET | Freq: Four times a day (QID) | ORAL | Status: DC | PRN

## 2014-05-13 MED ORDER — IBUPROFEN 800 MG PO TABS: 800.0000 mg | ORAL_TABLET | Freq: Three times a day (TID) | ORAL | Status: DC | PRN

## 2014-05-13 NOTE — Progress Notes (Signed)
Post Partum Day 1 Subjective: no complaints, up ad lib, voiding, tolerating PO and nl lochia, pain controlled  Objective: Blood pressure 125/68, pulse 61, temperature 98.7 F (37.1 C), temperature source Oral, resp. rate 20, height 5\' 10"  (1.778 m), weight 92.987 kg (205 lb), last menstrual period 08/08/2013, SpO2 99 %, unknown if currently breastfeeding.  Physical Exam:  General: alert and no distress Lochia: appropriate Uterine Fundus: firm   Recent Labs  05/11/14 0820 05/13/14 0545  HGB 12.2 10.6*  HCT 36.2 32.0*    Assessment/Plan: Discharge home, Breastfeeding and Lactation consult.  Routine care.  D/C with Motrin, percocet, PNV.  F/u 6 wks   LOS: 2 days   Bovard-Stuckert, Susan Hart 05/13/2014, 7:39 AM

## 2014-05-13 NOTE — Progress Notes (Signed)
CLINICAL SOCIAL WORK MATERNAL/CHILD NOTE  Patient Details  Name: Susan Hart MRN: [id number] Date of Birth: 05/12/2016  Date:  05/13/2014  Clinical Social Worker Initiating Note:  Loleta BooksSarah Verma Grothaus, LCSW Date/ Time Initiated:  05/13/14/0815     Child's Name:  Susan Hart   Legal Guardian:   Susan ArmsNikia Hart  Need for Interpreter:  None   Date of Referral:  05/12/14     Reason for Referral:  1) History of depression, anxiety, and panic attacks during the pregnancy 2) Relational stress with infant's [facility] and [facility]  [facility]   Address:  [address] [place], [place] [address]  Phone number:  [phone]   Household Members:   MOB stated that she has her own residence, but decided to move in with her parents during the end of the pregnancy for additional support. She stated that she will returning to her parents' home in the postpartum period for ongoing support. MOB also stated that her 31 year old daughter lives with her.   Natural Supports (not living in the home):  Friends, Immediate Family   Professional Supports: MOB stated that she has been able to talk to her OB about her mental health needs during the pregnancy.   Employment: Part-time   Type of Work: NT at [employer] (PRN)   Financial Resources:  Medicaid   Other Resources:  [employer] , [facility]   Cultural/Religious Considerations Which May Impact Care:  None reported  Strengths:  Home prepared for child , Pediatrician chosen , Ability to meet basic needs    Risk Factors/Current Problems:   1) Maternal history of depression/anxiety during the pregnancy-- increased risk for postpartum depression 2) Relationship between MOB and FOB recently ended. MOB reported mental health symptoms occurred secondary to this stress.     Cognitive State:  Able to Concentrate , Alert , Goal Oriented , Insightful    Mood/Affect:  Bright , Comfortable , Calm , Happy , Interested    CSW  Assessment:  MOB presented as easily engaged and receptive to the visit. She required minimal time to establish a sense of rapport with CSW.  She expressed excitement and readiness to be discharged and to return home.  She stated that she feels comfortable caring for the infant, and shared that she was surprised how quickly her comfort returned since her oldest daughter is 31 years old.  She shared that the home is well prepared for the infant, and that she has strong family support form her parents.   MOB continued to process and discuss the events that led to moving back with her parents during the end of the pregnancy. She stated that she and the FOB ended their relationship during her 8th month of pregnancy.  She did not discuss the exact details that led to their separation, but she stated that it was a difficult separation, and she experienced symptoms of depression, anxiety, and panic attacks.  She shared that due to the need for additional support, she and her daughter decided to stay at her parents home.  MOB discussed how it was a difficult transition (due to pride), but that it was ultimately a positive decision since she was able to talk to her parents about the end of the relationship, receive support, and work toward problem solving on how to move forward.  She shared that 3 weeks ago, she decided to establish boundaries with the FOB.  She stated that she is firm that he cannot  go "back and forth" between how involved he will be. She stated that it is not good for her mental health and negates a sense of stability for the infant. She discussed that she is no longer going to allow him to dictate the terms of the relationship, and that she is going to limit her contact with him if he is not able to be appropriate and respectful.  CSW noted that the MOB appeared proud of her abilities to establish boundaries with him,and she discussed how she is proud of her strength and has noted how much better she  feels since she decided to advocate on behalf of herself. She stated that her family has helped her to realize that she does not deserve to be treated poorly by the FOB.  MOB stated that she has allowed him to visit at the hospital and that he has been respectful and appropriate. She stated that she has decided to not allow him to be on the birth certificate, and she feels confident in her decision since he has shown various levels of support during the pregnancy. MOB also discussed stress related to the PGM, but stated that she has filed a restraining order against her and that she is no longer worried about the stress.   CSW continued to explore with the MOB her strengths that have assisted her to cope with these stressors. She identified her parents as her primary sense of support. MOB also discussed ability to use medications PRN for anxiety.  She stated that she prefers to talk with her parents, have some "space", deep breath, and then to problem solve on how to move forward.  MOB presented with insight and self-awareness about how to self-regulate, and she acknowledged that she has learned a lot about herself and her ability to self-regulate in the past week.  MOB continued to emphasize that her anxiety and depression have been greatly reduced in the past 2-3 weeks.    She presented with insight and awareness of her increased risk for developing postpartum mood disorders.  She stated that she has had conversations with her OB and that she has also researched symptoms and the diagnoses online.  MOB denied need for additional interventions to address symptoms at this time since she believes she has learned how to cope with her feelings. She denied need for a therapist, and expressed feeling comfortable returning to her OB if needed.    MOB denied additional questions, concerns, or needs at this time. She expressed satisfaction with her experiences at [facility], and denied awareness of areas of need  at this time. MOB agreed to contact CSW if needs arise.  CSW Plan/Description:   1) Patient/Family Education: CSW reviewed signs and symptoms of postpartum depression. MOB reported intention to utilize PRN medications (already has rx) if normal ways of coping with stress/anxiety are ineffective. She stated that she feels comfortable contacting her OB if she notes worsening of symptoms.  2) No Further Intervention Required/No Barriers to Discharge    Kelby Fam 05/13/2014, 1:04 PM

## 2014-05-13 NOTE — Discharge Summary (Signed)
Obstetric Discharge Summary Reason for Admission: induction of labor Prenatal Procedures: none Intrapartum Procedures: spontaneous vaginal delivery Postpartum Procedures: none Complications-Operative and Postpartum: none HEMOGLOBIN  Date Value Ref Range Status  05/13/2014 10.6* 12.0 - 15.0 g/dL Final   HCT  Date Value Ref Range Status  05/13/2014 32.0* 36.0 - 46.0 % Final    Physical Exam:  General: alert and no distress Lochia: appropriate Uterine Fundus: firm  Discharge Diagnoses: Term Pregnancy-delivered  Discharge Information: Date: 05/13/2014 Activity: pelvic rest Diet: routine Medications: Ibuprofen and Percocet Condition: stable Instructions: refer to practice specific booklet Discharge to: home Follow-up Information    Follow up with Bovard-Stuckert, Meldrick Buttery, MD. Schedule an appointment as soon as possible for a visit in 6 weeks.   Specialty:  Obstetrics and Gynecology   Why:  for post-partum check   Contact information:   [address] [place] [address] [phone]       Newborn Data: Live born female  Birth Weight: 7 lb 1.4 oz (3215 g) APGAR: 9, 9  Home with mother.  Bovard-Stuckert, Anacleto Batterman 05/13/2014, 7:44 AM

## 2014-05-13 NOTE — Lactation Note (Signed)
This note was copied from the chart of Girl Starleen ArmsNikia Bhavsar. Lactation Consultation Note; Mother planning discharge today. She denies having any concerns. Mother states that infant is feeding well. Staff nurse at bedside states she observed infants last feeding and that latch was good and swallows present. Reviewed infants need to cluster feed and feed at least 8-12 feeds in 24 hours. Mother has a hand pump at the bedside. Mother receptive to all teaching. Mother is aware of available LC services and community support.   Patient Name: Girl Starleen Armsikia Slider ZOXWR'UToday's Date: 05/13/2014 Reason for consult: Follow-up assessment   Maternal Data    Feeding Feeding Type: Breast Fed Length of feed: 30 min  LATCH Score/Interventions                      Lactation Tools Discussed/Used     Consult Status Consult Status: Complete Date: 05/13/14 Follow-up type: In-patient    Stevan BornKendrick, Lakeithia Rasor [facility] 05/13/2014, 12:24 PM

## 2016-01-30 ENCOUNTER — Emergency Department (HOSPITAL_COMMUNITY): Payer: Medicaid Other

## 2016-01-30 ENCOUNTER — Encounter (HOSPITAL_COMMUNITY): Payer: Self-pay | Admitting: Emergency Medicine

## 2016-01-30 ENCOUNTER — Emergency Department (HOSPITAL_COMMUNITY)
Admission: EM | Admit: 2016-01-30 | Discharge: 2016-01-30 | Disposition: A | Discharge status: Home / Self Care | Payer: Medicaid Other | Attending: Emergency Medicine | Admitting: Emergency Medicine

## 2016-01-30 DIAGNOSIS — S30811A Abrasion of abdominal wall, initial encounter: Secondary | ICD-10-CM | POA: Diagnosis not present

## 2016-01-30 DIAGNOSIS — S5012XA Contusion of left forearm, initial encounter: Secondary | ICD-10-CM

## 2016-01-30 DIAGNOSIS — Y929 Unspecified place or not applicable: Secondary | ICD-10-CM | POA: Insufficient documentation

## 2016-01-30 DIAGNOSIS — S50312A Abrasion of left elbow, initial encounter: Secondary | ICD-10-CM | POA: Insufficient documentation

## 2016-01-30 DIAGNOSIS — Y939 Activity, unspecified: Secondary | ICD-10-CM | POA: Diagnosis not present

## 2016-01-30 DIAGNOSIS — S59912A Unspecified injury of left forearm, initial encounter: Secondary | ICD-10-CM | POA: Diagnosis present

## 2016-01-30 DIAGNOSIS — W19XXXA Unspecified fall, initial encounter: Secondary | ICD-10-CM

## 2016-01-30 DIAGNOSIS — T07XXXA Unspecified multiple injuries, initial encounter: Secondary | ICD-10-CM

## 2016-01-30 DIAGNOSIS — Y999 Unspecified external cause status: Secondary | ICD-10-CM | POA: Insufficient documentation

## 2016-01-30 MED ORDER — ONDANSETRON HCL 4 MG PO TABS
4.0000 mg | ORAL_TABLET | Freq: Once | ORAL | Status: AC
  Administered 2016-01-30: 4 mg via ORAL
  Filled 2016-01-30: qty 1

## 2016-01-30 MED ORDER — IBUPROFEN 600 MG PO TABS: 600.0000 mg | ORAL_TABLET | Freq: Four times a day (QID) | ORAL | 0 refills | Status: DC | PRN

## 2016-01-30 MED ORDER — HYDROCODONE-ACETAMINOPHEN 5-325 MG PO TABS
2.0000 | ORAL_TABLET | Freq: Once | ORAL | Status: AC
  Administered 2016-01-30: 2 via ORAL
  Filled 2016-01-30: qty 2

## 2016-01-30 MED ORDER — HYDROCODONE-ACETAMINOPHEN 5-325 MG PO TABS: 1.0000 | ORAL_TABLET | ORAL | 0 refills | Status: DC | PRN

## 2016-01-30 NOTE — ED Provider Notes (Signed)
[facility] Provider Note   CSN: [id number] Arrival date & time: 01/30/16  1359     History   Chief Complaint Chief Complaint  Patient presents with  . Fall    HPI Susan Hart is a 31 y.o. female.  Patient is a 31 year old female who presents to the emergency department with a complaint of abrasions and pain in multiple sites.  The patient states that she fell off of her however board just prior to coming to the emergency department. She has abrasions of her left elbow and left palm. She has on pain of her left hip. She states she has been able to walk, but she has soreness that is getting worse. The patient denies being on any anticoagulation medications. She's not had any previous operations or procedures involving the sites.   The history is provided by the patient.    Past Medical History:  Diagnosis Date  . Anxiety    just since being pregnant, 2015  . Normal fetus during pregnancy in third trimester 05/10/2014  . SVD (spontaneous vaginal delivery) 05/12/2014    Patient Active Problem List   Diagnosis Date Noted  . SVD (spontaneous vaginal delivery) 05/12/2014  . Normal pregnancy, repeat 05/11/2014  . Normal fetus during pregnancy in third trimester 05/10/2014  . Chlamydia infection affecting pregnancy 03/09/2014    Past Surgical History:  Procedure Laterality Date  . NO PAST SURGERIES      OB History    Gravida Para Term Preterm AB Living   2 2 2     2    SAB TAB Ectopic Multiple Live Births         0 2       Home Medications    Prior to Admission medications   Medication Sig Start Date End Date Taking? Authorizing Provider  acetaminophen (TYLENOL) 500 MG tablet Take 1,000 mg by mouth every 6 (six) hours as needed for headache.     Historical Provider, MD  flintstones complete (FLINTSTONES) 60 MG chewable tablet Chew 2 tablets by mouth every evening.    Historical Provider, MD  ibuprofen (ADVIL,MOTRIN) 800 MG tablet Take 1 tablet (800 mg  total) by mouth every 8 (eight) hours as needed. 05/13/14   Jody Bovard-Stuckert, MD  IRON PO Take 1 tablet by mouth every evening.    Historical Provider, MD  oxyCODONE-acetaminophen (PERCOCET/ROXICET) 5-325 MG per tablet Take 1-2 tablets by mouth every 6 (six) hours as needed for severe pain. 05/13/14   Sherian Rein, MD    Family History Family History  Problem Relation Age of Onset  . Adopted: Yes  . Alcohol abuse Neg Hx   . Arthritis Neg Hx   . Asthma Neg Hx   . Birth defects Neg Hx   . Cancer Neg Hx   . COPD Neg Hx   . Depression Neg Hx   . Diabetes Neg Hx   . Early death Neg Hx   . Drug abuse Neg Hx   . Hearing loss Neg Hx   . Heart disease Neg Hx   . Hyperlipidemia Neg Hx   . Hypertension Neg Hx   . Kidney disease Neg Hx   . Learning disabilities Neg Hx   . Mental illness Neg Hx   . Mental retardation Neg Hx   . Miscarriages / Stillbirths Neg Hx   . Stroke Neg Hx   . Vision loss Neg Hx   . Varicose Veins Neg Hx     Social History Social History  Substance Use Topics  . Smoking status: Never Smoker  . Smokeless tobacco: Never Used  . Alcohol use No     Allergies   Diflucan [fluconazole]   Review of Systems Review of Systems  Constitutional: Negative for activity change.       All ROS Neg except as noted in HPI  HENT: Negative for nosebleeds.   Eyes: Negative for photophobia and discharge.  Respiratory: Negative for cough, shortness of breath and wheezing.   Cardiovascular: Negative for chest pain and palpitations.  Gastrointestinal: Negative for abdominal pain and blood in stool.  Genitourinary: Negative for dysuria, frequency and hematuria.  Musculoskeletal: Negative for arthralgias, back pain and neck pain.  Skin: Negative.        Abrasions  Neurological: Negative for dizziness, seizures and speech difficulty.  Psychiatric/Behavioral: Negative for confusion and hallucinations. The patient is nervous/anxious.      Physical Exam Updated  Vital Signs BP 131/69 (BP Location: Right Arm)   Pulse 93   Temp 100.4 F (38 C) (Temporal)   Resp 18   Ht 5\' 11"  (1.803 m)   Wt 79.4 kg   LMP 01/23/2016   SpO2 100%   BMI 24.41 kg/m   Physical Exam  Constitutional: She is oriented to person, place, and time. She appears well-developed and well-nourished.  Non-toxic appearance.  HENT:  Head: Normocephalic.  Right Ear: Tympanic membrane and external ear normal.  Left Ear: Tympanic membrane and external ear normal.  Eyes: EOM and lids are normal. Pupils are equal, round, and reactive to light.  Neck: Normal range of motion. Neck supple. Carotid bruit is not present.  Cardiovascular: Normal rate, regular rhythm, normal heart sounds, intact distal pulses and normal pulses.   Pulmonary/Chest: Breath sounds normal. No respiratory distress.  There is symmetrical rise and fall of the chest. The patient speaks in complete sentences without problem.  Abdominal: Soft. Bowel sounds are normal. There is no tenderness. There is no guarding.  Abrasion/road rash of the left flank area. This no rib tenderness or rib deformity or shaded.  Musculoskeletal: Normal range of motion.  There is an abrasion of the palm of the left hand. There is a bruise and a palm of the right hand. There is pain to range of motion of the left hip. No deformity appreciated.   Lymphadenopathy:       Head (right side): No submandibular adenopathy present.       Head (left side): No submandibular adenopathy present.    She has no cervical adenopathy.  Neurological: She is alert and oriented to person, place, and time. She has normal strength. No cranial nerve deficit or sensory deficit.  Skin: Skin is warm and dry.  Psychiatric: She has a normal mood and affect. Her speech is normal.  Nursing note and vitals reviewed.    ED Treatments / Results  Labs (all labs ordered are listed, but only abnormal results are displayed) Labs Reviewed - No data to display  EKG  EKG  Interpretation None       Radiology Dg Forearm Left  Result Date: 01/30/2016 CLINICAL DATA:  Left forearm pain. EXAM: LEFT FOREARM - 2 VIEW COMPARISON:  No recent prior. FINDINGS: No acute bony or joint abnormality identified. No evidence of fracture dislocation. IMPRESSION: No acute abnormality. Electronically Signed   By: Maisie Fushomas  Register   On: 01/30/2016 15:45    Procedures Procedures (including critical care time)  Medications Ordered in ED Medications  HYDROcodone-acetaminophen (NORCO/VICODIN) 5-325 MG per tablet 2  tablet (2 tablets Oral Given 01/30/16 1540)  ondansetron (ZOFRAN) tablet 4 mg (4 mg Oral Given 01/30/16 1541)     Initial Impression / Assessment and Plan / ED Course  I have reviewed the triage vital signs and the nursing notes.  Pertinent labs & imaging results that were available during my care of the patient were reviewed by me and considered in my medical decision making (see chart for details).  Clinical Course     **I have reviewed nursing notes, vital signs, and all appropriate lab and imaging results for this patient.*  Final Clinical Impressions(s) / ED Diagnoses  Temperature 100.4 which is elevated. Vital signs within normal limits. Patient noted to have abrasions of multiple sites. X-rays of the forearm were negative for fracture or dislocation. The patient is ambulatory in the room and hall with minimal problem.  The plan at this time for the patient use ice pack to the bruised areas, and change dressing to the abrasions daily with triple antibiotic ointment. Prescription for ibuprofen and Flexeril given to the patient. Patient knowledge is understanding of the discharge instructions.    Final diagnoses:  Fall  Abrasions of multiple sites  Contusion of left forearm, initial encounter    New Prescriptions New Prescriptions   No medications on file     Ivery Quale, PA-C 01/30/16 1628    Vanetta Mulders, MD 02/04/16 [phone]

## 2016-01-30 NOTE — ED Notes (Signed)
Pt taken to xray 

## 2016-01-30 NOTE — ED Triage Notes (Signed)
Patient states she fell off her hoverboard today. Patient has abrasion noted to left elbow area and left palm. Complaining of pain to bilateral hands and left hip. Patient ambulatory at triage.

## 2016-01-30 NOTE — Discharge Instructions (Signed)
The x-ray of the forearm is negative for fracture or dislocation. Please cleanse the wound daily with soap and water and apply dressing. Use ibuprofen every 6 hours for soreness. May use Norco for more severe soreness. This medication may cause drowsiness, please do not drive, operate machinery, drink alcohol, or participate in activities requiring concentration when taking this medication. Please see your primary physician or return to the emergency department if not improving.

## 2017-07-11 ENCOUNTER — Other Ambulatory Visit: Payer: Self-pay

## 2017-07-11 ENCOUNTER — Encounter (HOSPITAL_COMMUNITY): Payer: Self-pay | Admitting: *Deleted

## 2017-07-11 ENCOUNTER — Ambulatory Visit (HOSPITAL_COMMUNITY)
Admission: EM | Admit: 2017-07-11 | Discharge: 2017-07-11 | Disposition: A | Discharge status: Home / Self Care | Payer: Medicaid Other | Attending: Family Medicine | Admitting: Family Medicine

## 2017-07-11 ENCOUNTER — Encounter (HOSPITAL_COMMUNITY): Payer: Self-pay | Admitting: Emergency Medicine

## 2017-07-11 DIAGNOSIS — B349 Viral infection, unspecified: Secondary | ICD-10-CM

## 2017-07-11 DIAGNOSIS — F419 Anxiety disorder, unspecified: Secondary | ICD-10-CM | POA: Diagnosis not present

## 2017-07-11 DIAGNOSIS — J069 Acute upper respiratory infection, unspecified: Secondary | ICD-10-CM | POA: Diagnosis not present

## 2017-07-11 DIAGNOSIS — M791 Myalgia, unspecified site: Secondary | ICD-10-CM | POA: Diagnosis not present

## 2017-07-11 DIAGNOSIS — R51 Headache: Secondary | ICD-10-CM | POA: Diagnosis not present

## 2017-07-11 LAB — POCT URINALYSIS DIP (DEVICE)
BILIRUBIN URINE: NEGATIVE
Glucose, UA: NEGATIVE mg/dL
HGB URINE DIPSTICK: NEGATIVE
Ketones, ur: NEGATIVE mg/dL
Leukocytes, UA: NEGATIVE
Nitrite: NEGATIVE
PH: 6.5 (ref 5.0–8.0)
Protein, ur: NEGATIVE mg/dL
Specific Gravity, Urine: 1.02 (ref 1.005–1.030)
UROBILINOGEN UA: 1 mg/dL (ref 0.0–1.0)

## 2017-07-11 MED ORDER — KETOROLAC TROMETHAMINE 60 MG/2ML IM SOLN
INTRAMUSCULAR | Status: AC
  Filled 2017-07-11: qty 2

## 2017-07-11 MED ORDER — KETOROLAC TROMETHAMINE 60 MG/2ML IM SOLN
60.0000 mg | Freq: Once | INTRAMUSCULAR | Status: AC
  Administered 2017-07-11: 60 mg via INTRAMUSCULAR

## 2017-07-11 MED ORDER — IBUPROFEN 800 MG PO TABS: 800.0000 mg | ORAL_TABLET | Freq: Three times a day (TID) | ORAL | 0 refills | Status: DC

## 2017-07-11 NOTE — Discharge Instructions (Signed)
Rest Drink plenty of fluids Take ibuprofen 3 times a day with food for pain and body aches Return if not better in a couple of days

## 2017-07-11 NOTE — ED Triage Notes (Signed)
[facility] and dx with a viral illness.  Pt is here for further evaluation because she continues to have chills.  Pt is alert and oriented, no acute distress noted.

## 2017-07-11 NOTE — ED Triage Notes (Signed)
Pt c/o chills all day, body aches, chest pain, states "I just cant get warm".

## 2017-07-11 NOTE — ED Provider Notes (Signed)
[facility]    CSN: [id number] Arrival date & time: 07/11/17  1727     History   Chief Complaint Chief Complaint  Patient presents with  . Generalized Body Aches    HPI Susan Hart is a 32 y.o. female.   HPI  Patient complains of aching all over.  Headache.  Body aches.  It happened suddenly this morning.  Is gotten worse throughout the day.  She has had several episodes of chills.  No known fever.  She has no cough cold runny nose or sore throat.  She has no history of migraines.  She has no nausea vomiting or diarrhea.  No urinary frequency.  No dysuria.  No vaginal discharge.  She is certain that she is not pregnant.  No known exposure to any illness.  No recent travel.  Is taken no medicine for the symptoms.  Past Medical History:  Diagnosis Date  . Anxiety    just since being pregnant, 2015  . Normal fetus during pregnancy in third trimester 05/10/2014  . SVD (spontaneous vaginal delivery) 05/12/2014    Patient Active Problem List   Diagnosis Date Noted  . SVD (spontaneous vaginal delivery) 05/12/2014  . Normal pregnancy, repeat 05/11/2014  . Normal fetus during pregnancy in third trimester 05/10/2014  . Chlamydia infection affecting pregnancy 03/09/2014    Past Surgical History:  Procedure Laterality Date  . NO PAST SURGERIES      OB History    Gravida  2   Para  2   Term  2   Preterm      AB      Living  2     SAB      TAB      Ectopic      Multiple  0   Live Births  2            Home Medications    Prior to Admission medications   Medication Sig Start Date End Date Taking? Authorizing Provider  acetaminophen (TYLENOL) 500 MG tablet Take 1,000 mg by mouth every 6 (six) hours as needed for headache.     [provider]  flintstones complete (FLINTSTONES) 60 MG chewable tablet Chew 2 tablets by mouth every evening.    [provider]  ibuprofen (ADVIL,MOTRIN) 800 MG tablet Take 1 tablet (800 mg total) by  mouth 3 (three) times daily. 07/11/17   Eustace Moore, MD  IRON PO Take 1 tablet by mouth every evening.    [provider]    Family History Family History  Adopted: Yes  Problem Relation Age of Onset  . Alcohol abuse Neg Hx   . Arthritis Neg Hx   . Asthma Neg Hx   . Birth defects Neg Hx   . Cancer Neg Hx   . COPD Neg Hx   . Depression Neg Hx   . Diabetes Neg Hx   . Early death Neg Hx   . Drug abuse Neg Hx   . Hearing loss Neg Hx   . Heart disease Neg Hx   . Hyperlipidemia Neg Hx   . Hypertension Neg Hx   . Kidney disease Neg Hx   . Learning disabilities Neg Hx   . Mental illness Neg Hx   . Mental retardation Neg Hx   . Miscarriages / Stillbirths Neg Hx   . Stroke Neg Hx   . Vision loss Neg Hx   . Varicose Veins Neg Hx  Social History Social History   Tobacco Use  . Smoking status: Never Smoker  . Smokeless tobacco: Never Used  Substance Use Topics  . Alcohol use: No  . Drug use: No     Allergies   Diflucan [fluconazole]   Review of Systems Review of Systems  Constitutional: Positive for fatigue. Negative for chills and fever.  HENT: Negative for congestion, ear pain, rhinorrhea and sore throat.   Eyes: Negative for pain and visual disturbance.  Respiratory: Negative for cough and shortness of breath.   Cardiovascular: Negative for chest pain and palpitations.  Gastrointestinal: Negative for abdominal pain, diarrhea, nausea and vomiting.  Genitourinary: Negative for dysuria, hematuria and vaginal discharge.  Musculoskeletal: Positive for myalgias. Negative for arthralgias and back pain.  Skin: Negative for color change and rash.  Neurological: Positive for headaches. Negative for seizures and syncope.  All other systems reviewed and are negative.    Physical Exam Triage Vital Signs ED Triage Vitals  Enc Vitals Group     BP 07/11/17 1739 115/61     Pulse Rate 07/11/17 1739 89     Resp 07/11/17 1739 16     Temp 07/11/17 1739 98.9  F (37.2 C)     Temp Source 07/11/17 1855 Temporal     SpO2 07/11/17 1739 100 %     Weight --      Height --      Head Circumference --      Peak Flow --      Pain Score --      Pain Loc --      Pain Edu? --      Excl. in GC? --    No data found.  Updated Vital Signs BP (!) 113/42 (BP Location: Right Arm)   Pulse 78   Temp 99.7 F (37.6 C) (Temporal)   Resp 18   LMP 07/10/2017   SpO2 100%   Visual Acuity Right Eye Distance:   Left Eye Distance:   Bilateral Distance:    Right Eye Near:   Left Eye Near:    Bilateral Near:     Physical Exam  Constitutional: She appears well-developed and well-nourished. She appears distressed.  Appears uncomfortable.  Wrapped in blankets.  Sunglasses in place due to headache.  HENT:  Head: Normocephalic and atraumatic.  Right Ear: External ear normal.  Left Ear: External ear normal.  Nose: Nose normal.  Mouth/Throat: Oropharynx is clear and moist.  Eyes: Pupils are equal, round, and reactive to light. Conjunctivae are normal.  Neck: Normal range of motion.  Cardiovascular: Normal rate, regular rhythm and normal heart sounds.  Pulmonary/Chest: Effort normal and breath sounds normal. No respiratory distress. She has no wheezes.  Abdominal: Soft. Bowel sounds are normal. She exhibits no distension. There is no tenderness.  Musculoskeletal: Normal range of motion. She exhibits no edema.  Lymphadenopathy:    She has no cervical adenopathy.  Neurological: She is alert.  Skin: Skin is warm and dry.  Psychiatric: She has a normal mood and affect. Her behavior is normal.     UC Treatments / Results  Labs (all labs ordered are listed, but only abnormal results are displayed) Labs Reviewed  POCT URINALYSIS DIP (DEVICE)    EKG None  Radiology No results found.  Procedures Procedures (including critical care time)  Medications Ordered in UC Medications  ketorolac (TORADOL) injection 60 mg (60 mg Intramuscular Given 07/11/17  1841)    Initial Impression / Assessment and Plan / UC Course  I have reviewed the triage vital signs and the nursing notes.  Pertinent labs & imaging results that were available during my care of the patient were reviewed by me and considered in my medical decision making (see chart for details).     No physical exam findings to suggest source of illness.  No historical findings to suggest source of illness.  I believe this is a virus.  She developed more chills during her stay and a second temperature was 99.7.  This is after her shot of Toradol.  Explained to the patient that I think she is coming down with something, likely viral syndrome.  Return for worsening symptoms. Final Clinical Impressions(s) / UC Diagnoses   Final diagnoses:  Viral syndrome     Discharge Instructions     Rest Drink plenty of fluids Take ibuprofen 3 times a day with food for pain and body aches Return if not better in a couple of days   ED Prescriptions    Medication Sig Dispense Auth. Provider   ibuprofen (ADVIL,MOTRIN) 800 MG tablet Take 1 tablet (800 mg total) by mouth 3 (three) times daily. 21 tablet Eustace Moore, MD     Controlled Substance Prescriptions [place] Controlled Substance Registry consulted? Not Applicable   Eustace Moore, MD 07/11/17 2109

## 2017-07-12 ENCOUNTER — Emergency Department (HOSPITAL_COMMUNITY)
Admission: EM | Admit: 2017-07-12 | Discharge: 2017-07-12 | Disposition: A | Discharge status: Home / Self Care | Payer: Medicaid Other | Attending: Emergency Medicine | Admitting: Emergency Medicine

## 2017-07-12 DIAGNOSIS — J069 Acute upper respiratory infection, unspecified: Secondary | ICD-10-CM

## 2017-07-12 DIAGNOSIS — F419 Anxiety disorder, unspecified: Secondary | ICD-10-CM

## 2017-07-12 MED ORDER — LORAZEPAM 1 MG PO TABS
1.0000 mg | ORAL_TABLET | Freq: Once | ORAL | Status: AC
  Administered 2017-07-12: 1 mg via ORAL
  Filled 2017-07-12: qty 1

## 2017-07-12 NOTE — ED Provider Notes (Signed)
[facility] EMERGENCY DEPARTMENT Provider Note   CSN: [id number] Arrival date & time: 07/11/17  2202     History   Chief Complaint Chief Complaint  Patient presents with  . Illness    HPI Susan Hart is a 32 y.o. female.  Pt c/o numbness and tingling in hands and legs today.   The history is provided by the patient.  URI   This is a new problem. The problem has not changed since onset.There has been no fever. Associated symptoms include diarrhea and headaches. Pertinent negatives include no chest pain, no abdominal pain, no dysuria, no congestion, no sneezing, no sore throat, no neck pain, no cough and no wheezing.    Past Medical History:  Diagnosis Date  . Anxiety    just since being pregnant, 2015  . Normal fetus during pregnancy in third trimester 05/10/2014  . SVD (spontaneous vaginal delivery) 05/12/2014    Patient Active Problem List   Diagnosis Date Noted  . SVD (spontaneous vaginal delivery) 05/12/2014  . Normal pregnancy, repeat 05/11/2014  . Normal fetus during pregnancy in third trimester 05/10/2014  . Chlamydia infection affecting pregnancy 03/09/2014    Past Surgical History:  Procedure Laterality Date  . NO PAST SURGERIES       OB History    Gravida  2   Para  2   Term  2   Preterm      AB      Living  2     SAB      TAB      Ectopic      Multiple  0   Live Births  2            Home Medications    Prior to Admission medications   Medication Sig Start Date End Date Taking? Authorizing Provider  acetaminophen (TYLENOL) 500 MG tablet Take 1,000 mg by mouth every 6 (six) hours as needed for headache.     [provider]  flintstones complete (FLINTSTONES) 60 MG chewable tablet Chew 2 tablets by mouth every evening.    [provider]  ibuprofen (ADVIL,MOTRIN) 800 MG tablet Take 1 tablet (800 mg total) by mouth 3 (three) times daily. 07/11/17   Eustace Moore, MD  IRON PO Take 1 tablet by mouth every  evening.    [provider]    Family History Family History  Adopted: Yes  Problem Relation Age of Onset  . Alcohol abuse Neg Hx   . Arthritis Neg Hx   . Asthma Neg Hx   . Birth defects Neg Hx   . Cancer Neg Hx   . COPD Neg Hx   . Depression Neg Hx   . Diabetes Neg Hx   . Early death Neg Hx   . Drug abuse Neg Hx   . Hearing loss Neg Hx   . Heart disease Neg Hx   . Hyperlipidemia Neg Hx   . Hypertension Neg Hx   . Kidney disease Neg Hx   . Learning disabilities Neg Hx   . Mental illness Neg Hx   . Mental retardation Neg Hx   . Miscarriages / Stillbirths Neg Hx   . Stroke Neg Hx   . Vision loss Neg Hx   . Varicose Veins Neg Hx     Social History Social History   Tobacco Use  . Smoking status: Never Smoker  . Smokeless tobacco: Never Used  Substance Use Topics  . Alcohol use: No  .  Drug use: No     Allergies   Diflucan [fluconazole]   Review of Systems Review of Systems  Constitutional: Positive for chills and fever. Negative for activity change.       All ROS Neg except as noted in HPI  HENT: Negative for congestion, nosebleeds, sneezing and sore throat.   Eyes: Negative for photophobia and discharge.  Respiratory: Negative for cough, shortness of breath and wheezing.   Cardiovascular: Negative for chest pain and palpitations.  Gastrointestinal: Positive for diarrhea. Negative for abdominal pain and blood in stool.  Genitourinary: Negative for dysuria, frequency and hematuria.  Musculoskeletal: Positive for myalgias. Negative for arthralgias, back pain and neck pain.  Skin: Negative.   Neurological: Positive for numbness and headaches. Negative for dizziness, seizures and speech difficulty.  Psychiatric/Behavioral: Negative for confusion and hallucinations. The patient is nervous/anxious.      Physical Exam Updated Vital Signs BP 103/61 (BP Location: Left Arm)   Pulse 81   Temp 98.3 F (36.8 C) (Oral)   Resp 16   Wt 80.6 kg (177 lb 12.8  oz)   LMP 07/10/2017   SpO2 97%   BMI 24.80 kg/m   Physical Exam  Constitutional: She is oriented to person, place, and time. She appears well-developed and well-nourished.  Non-toxic appearance.  HENT:  Head: Normocephalic.  Right Ear: Tympanic membrane and external ear normal.  Left Ear: Tympanic membrane and external ear normal.  Eyes: Pupils are equal, round, and reactive to light. EOM and lids are normal.  Neck: Normal range of motion. Neck supple. Carotid bruit is not present.  Cardiovascular: Normal rate, regular rhythm, normal heart sounds, intact distal pulses and normal pulses.  Pulmonary/Chest: Breath sounds normal. No respiratory distress.  Pt speaks in complete sentences without problem.  Abdominal: Soft. Bowel sounds are normal. There is no tenderness. There is no guarding.  Musculoskeletal: Normal range of motion.  Lymphadenopathy:       Head (right side): No submandibular adenopathy present.       Head (left side): No submandibular adenopathy present.    She has no cervical adenopathy.  Neurological: She is alert and oriented to person, place, and time. She has normal strength. No cranial nerve deficit or sensory deficit.  Skin: Skin is warm and dry.  Psychiatric: She has a normal mood and affect. Her speech is normal.  Nursing note and vitals reviewed.    ED Treatments / Results  Labs (all labs ordered are listed, but only abnormal results are displayed) Labs Reviewed - No data to display  EKG None  Radiology No results found.  Procedures Procedures (including critical care time)  Medications Ordered in ED Medications - No data to display   Initial Impression / Assessment and Plan / ED Course  I have reviewed the triage vital signs and the nursing notes.  Pertinent labs & imaging results that were available during my care of the patient were reviewed by me and considered in my medical decision making (see chart for details).      Final Clinical  Impressions(s) / ED Diagnoses MDM  Vital signs within normal limits.  Pulse oximetry is 97% on room air.  Patient speaks in complete sentences without problem.  The patient states that she received word that a very close friend had passed away recently, and she also had problems with anxiety about some of the situations going on at her home.  She was diagnosed earlier today with an upper respiratory infection.  She states that  while she was at home she was thinking about all of these things and developed problems with tingling and numbness in the extremities.  The examination favors upper respiratory infection and probably hyperventilation related to anxiety.  I reassured the patient that her examination at this time was stable.  The patient denies any suicidal homicidal ideations.  I have asked her to see the physicians at [facility] for further assistance with her anxiety.  Patient will return to the emergency department if any changes in condition, problems, or concerns.   Final diagnoses:  Anxiety  Viral upper respiratory tract infection    ED Discharge Orders    None       Ivery QualeBryant, Raelee Rossmann, PA-C 07/12/17 0136    Devoria AlbeKnapp, Iva, MD 07/12/17 [phone]

## 2017-07-12 NOTE — Discharge Instructions (Addendum)
Your vital signs are within normal limits.  Your oxygen level is 97% on room air.  Your examination shows possible upper respiratory infection.  Your examination and history suggest probable hyperventilation related to anxiety.  Please discuss this with your primary physician or the physicians at [facility].

## 2018-02-10 ENCOUNTER — Encounter (HOSPITAL_COMMUNITY): Payer: Self-pay | Admitting: *Deleted

## 2018-02-10 ENCOUNTER — Emergency Department (HOSPITAL_COMMUNITY)
Admission: EM | Admit: 2018-02-10 | Discharge: 2018-02-11 | Disposition: A | Discharge status: Home / Self Care | Payer: Medicaid Other | Attending: Emergency Medicine | Admitting: Emergency Medicine

## 2018-02-10 ENCOUNTER — Other Ambulatory Visit: Payer: Self-pay

## 2018-02-10 DIAGNOSIS — Y929 Unspecified place or not applicable: Secondary | ICD-10-CM | POA: Insufficient documentation

## 2018-02-10 DIAGNOSIS — S9032XA Contusion of left foot, initial encounter: Secondary | ICD-10-CM | POA: Insufficient documentation

## 2018-02-10 DIAGNOSIS — S99922A Unspecified injury of left foot, initial encounter: Secondary | ICD-10-CM | POA: Diagnosis present

## 2018-02-10 DIAGNOSIS — W208XXA Other cause of strike by thrown, projected or falling object, initial encounter: Secondary | ICD-10-CM | POA: Diagnosis not present

## 2018-02-10 DIAGNOSIS — Y93E1 Activity, personal bathing and showering: Secondary | ICD-10-CM | POA: Insufficient documentation

## 2018-02-10 DIAGNOSIS — Y999 Unspecified external cause status: Secondary | ICD-10-CM | POA: Diagnosis not present

## 2018-02-10 NOTE — ED Triage Notes (Signed)
Pt states this am before work while getting in the shower a bottle of shower gel fell on top of her left foot; pt has pain and some swelling to top of left foot; pt states the pain has been getting progressively worse throughtout the day

## 2018-02-11 ENCOUNTER — Emergency Department (HOSPITAL_COMMUNITY): Payer: Medicaid Other

## 2018-02-11 MED ORDER — IBUPROFEN 400 MG PO TABS
600.0000 mg | ORAL_TABLET | Freq: Once | ORAL | Status: AC
Start: 2018-02-11 — End: 2018-02-11
  Administered 2018-02-11: 600 mg via ORAL
  Filled 2018-02-11: qty 2

## 2018-02-11 NOTE — ED Provider Notes (Signed)
[facility] EMERGENCY DEPARTMENT Provider Note   CSN: [id number] Arrival date & time: 02/10/18  2327  Time seen 12:56 AM   History   Chief Complaint Chief Complaint  Patient presents with  . Foot Injury    HPI Susan Hart is a 33 y.o. female.  HPI patient states about 7011 AM on January 21 she was in the shower getting ready for work and a bottle of wash gel fell onto her left foot near the MTP joints of her second and third toe.  She states she has been walking all day today at work and her pain is getting worse this evening.  PCP System, Pcp Not In   Past Medical History:  Diagnosis Date  . Anxiety    just since being pregnant, 2015  . Normal fetus during pregnancy in third trimester 05/10/2014  . SVD (spontaneous vaginal delivery) 05/12/2014    Patient Active Problem List   Diagnosis Date Noted  . SVD (spontaneous vaginal delivery) 05/12/2014  . Normal pregnancy, repeat 05/11/2014  . Normal fetus during pregnancy in third trimester 05/10/2014  . Chlamydia infection affecting pregnancy 03/09/2014    Past Surgical History:  Procedure Laterality Date  . NO PAST SURGERIES       OB History    Gravida  2   Para  2   Term  2   Preterm      AB      Living  2     SAB      TAB      Ectopic      Multiple  0   Live Births  2            Home Medications    Prior to Admission medications   Medication Sig Start Date End Date Taking? Authorizing Provider  acetaminophen (TYLENOL) 500 MG tablet Take 1,000 mg by mouth every 6 (six) hours as needed for headache.     [provider]  flintstones complete (FLINTSTONES) 60 MG chewable tablet Chew 2 tablets by mouth every evening.    [provider]  ibuprofen (ADVIL,MOTRIN) 800 MG tablet Take 1 tablet (800 mg total) by mouth 3 (three) times daily. 07/11/17   Eustace MooreNelson, Yvonne Sue, MD  IRON PO Take 1 tablet by mouth every evening.    [provider]    Family History Family  History  Adopted: Yes  Problem Relation Age of Onset  . Alcohol abuse Neg Hx   . Arthritis Neg Hx   . Asthma Neg Hx   . Birth defects Neg Hx   . Cancer Neg Hx   . COPD Neg Hx   . Depression Neg Hx   . Diabetes Neg Hx   . Early death Neg Hx   . Drug abuse Neg Hx   . Hearing loss Neg Hx   . Heart disease Neg Hx   . Hyperlipidemia Neg Hx   . Hypertension Neg Hx   . Kidney disease Neg Hx   . Learning disabilities Neg Hx   . Mental illness Neg Hx   . Mental retardation Neg Hx   . Miscarriages / Stillbirths Neg Hx   . Stroke Neg Hx   . Vision loss Neg Hx   . Varicose Veins Neg Hx     Social History Social History   Tobacco Use  . Smoking status: Never Smoker  . Smokeless tobacco: Never Used  Substance Use Topics  . Alcohol use: No  . Drug use:  No  employed   Allergies   Diflucan [fluconazole]   Review of Systems Review of Systems  All other systems reviewed and are negative.    Physical Exam Updated Vital Signs BP (!) 124/55 (BP Location: Left Arm)   Pulse 67   Temp 97.9 F (36.6 C) (Oral)   Resp 18   Ht 5\' 11"  (1.803 m)   Wt 84.4 kg   LMP 01/28/2018   SpO2 100%   BMI 25.94 kg/m   Physical Exam Vitals signs and nursing note reviewed.  Constitutional:      Appearance: Normal appearance.     Comments: Working on her laptop computer  HENT:     Head: Normocephalic and atraumatic.     Nose: Nose normal.  Eyes:     Extraocular Movements: Extraocular movements intact.     Conjunctiva/sclera: Conjunctivae normal.  Neck:     Musculoskeletal: Normal range of motion.  Cardiovascular:     Rate and Rhythm: Normal rate.  Pulmonary:     Effort: Pulmonary effort is normal. No respiratory distress.  Musculoskeletal: Normal range of motion.        General: Tenderness present. No swelling or deformity.     Comments: When I look at patient's foot there is no obvious swelling or bruising.  She points to the area of the MTP joints of her second and third toes as  to where her pain is located and she is also tender in those areas.  The toes themselves distally are nontender so concern for subungual hematoma is low, she has toenail polish in place so cannot see underneath the nail.  Skin:    General: Skin is warm and dry.     Capillary Refill: Capillary refill takes less than 2 seconds.     Findings: No bruising, erythema, lesion or rash.  Neurological:     General: No focal deficit present.     Mental Status: She is alert and oriented to person, place, and time.  Psychiatric:        Mood and Affect: Mood normal.        Behavior: Behavior normal.        Thought Content: Thought content normal.      ED Treatments / Results  Labs (all labs ordered are listed, but only abnormal results are displayed) Labs Reviewed - No data to display  EKG None  Radiology Dg Foot Complete Left  Result Date: 02/11/2018 CLINICAL DATA:  Foot injury EXAM: LEFT FOOT - COMPLETE 3+ VIEW COMPARISON:  None. FINDINGS: There is no evidence of fracture or dislocation. There is no evidence of arthropathy or other focal bone abnormality. Soft tissues are unremarkable. IMPRESSION: Negative. Electronically Signed   By: Jasmine Pang M.D.   On: 02/11/2018 00:51    Procedures Procedures (including critical care time)  Medications Ordered in ED Medications  ibuprofen (ADVIL,MOTRIN) tablet 600 mg (has no administration in time range)     Initial Impression / Assessment and Plan / ED Course  I have reviewed the triage vital signs and the nursing notes.  Pertinent labs & imaging results that were available during my care of the patient were reviewed by me and considered in my medical decision making (see chart for details).    I have looked at patient's x-ray and do not see any obvious fracture.  Patient was given a copy of her x-ray.  She was advised to keep it elevated, use ice packs, she was placed in a postop shoe.  Hopefully she just has a contusion, however she still  painful in 7 to 10 days she should be reevaluated to look for an occult fracture.  Final Clinical Impressions(s) / ED Diagnoses   Final diagnoses:  Contusion of left foot, initial encounter    ED Discharge Orders    None    OTC ibuprofen   Plan discharge  Devoria AlbeIva Peregrine Nolt, MD, [facility] PyoFACEP    Emmilee Reamer, MD 02/11/18 [id number]

## 2018-02-11 NOTE — Discharge Instructions (Addendum)
Wear the postop shoe for comfort, you can go back to regular shoes as your pain allows.  Take ibuprofen 600 mg 4 times a day for pain.  Use ice packs for comfort.  Recheck if not improving in the next 7 to 10 days, you could go call Dr. Mort Sawyers office to get an appointment.  He is an orthopedist or you can be seen by Dr. Nolen Mu, a podiatrist.

## 2018-10-19 ENCOUNTER — Other Ambulatory Visit: Payer: Self-pay

## 2018-10-19 DIAGNOSIS — Z20822 Contact with and (suspected) exposure to covid-19: Secondary | ICD-10-CM

## 2018-10-21 LAB — NOVEL CORONAVIRUS, NAA: SARS-CoV-2, NAA: NOT DETECTED

## 2018-12-20 ENCOUNTER — Emergency Department (HOSPITAL_BASED_OUTPATIENT_CLINIC_OR_DEPARTMENT_OTHER): Payer: Medicaid Other

## 2018-12-20 ENCOUNTER — Encounter (HOSPITAL_BASED_OUTPATIENT_CLINIC_OR_DEPARTMENT_OTHER): Payer: Self-pay | Admitting: Emergency Medicine

## 2018-12-20 ENCOUNTER — Other Ambulatory Visit: Payer: Self-pay

## 2018-12-20 ENCOUNTER — Emergency Department (HOSPITAL_BASED_OUTPATIENT_CLINIC_OR_DEPARTMENT_OTHER)
Admission: EM | Admit: 2018-12-20 | Discharge: 2018-12-20 | Disposition: A | Discharge status: Home / Self Care | Payer: Medicaid Other | Attending: Emergency Medicine | Admitting: Emergency Medicine

## 2018-12-20 DIAGNOSIS — S39012A Strain of muscle, fascia and tendon of lower back, initial encounter: Secondary | ICD-10-CM | POA: Diagnosis not present

## 2018-12-20 DIAGNOSIS — Y92410 Unspecified street and highway as the place of occurrence of the external cause: Secondary | ICD-10-CM | POA: Diagnosis not present

## 2018-12-20 DIAGNOSIS — S3992XA Unspecified injury of lower back, initial encounter: Secondary | ICD-10-CM | POA: Diagnosis present

## 2018-12-20 DIAGNOSIS — S161XXA Strain of muscle, fascia and tendon at neck level, initial encounter: Secondary | ICD-10-CM | POA: Diagnosis not present

## 2018-12-20 DIAGNOSIS — Y93I9 Activity, other involving external motion: Secondary | ICD-10-CM | POA: Diagnosis not present

## 2018-12-20 DIAGNOSIS — Y999 Unspecified external cause status: Secondary | ICD-10-CM | POA: Diagnosis not present

## 2018-12-20 MED ORDER — METHOCARBAMOL 500 MG PO TABS: 500.0000 mg | ORAL_TABLET | Freq: Two times a day (BID) | ORAL | 0 refills | Status: DC

## 2018-12-20 MED ORDER — ACETAMINOPHEN 500 MG PO TABS: 500.0000 mg | ORAL_TABLET | Freq: Four times a day (QID) | ORAL | 0 refills | Status: DC | PRN

## 2018-12-20 MED ORDER — IBUPROFEN 600 MG PO TABS: 600.0000 mg | ORAL_TABLET | Freq: Four times a day (QID) | ORAL | 0 refills | Status: DC | PRN

## 2018-12-20 NOTE — ED Notes (Signed)
Patient transported to X-ray. Pt ambulated offered wheelchair, pt declined.

## 2018-12-20 NOTE — Discharge Instructions (Addendum)
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
## 2018-12-26 ENCOUNTER — Emergency Department (HOSPITAL_BASED_OUTPATIENT_CLINIC_OR_DEPARTMENT_OTHER)
Admission: EM | Admit: 2018-12-26 | Discharge: 2018-12-26 | Disposition: A | Discharge status: Home / Self Care | Payer: Medicaid Other | Attending: Emergency Medicine | Admitting: Emergency Medicine

## 2018-12-26 ENCOUNTER — Encounter (HOSPITAL_BASED_OUTPATIENT_CLINIC_OR_DEPARTMENT_OTHER): Payer: Self-pay | Admitting: Emergency Medicine

## 2018-12-26 ENCOUNTER — Emergency Department (HOSPITAL_BASED_OUTPATIENT_CLINIC_OR_DEPARTMENT_OTHER): Payer: Medicaid Other

## 2018-12-26 ENCOUNTER — Other Ambulatory Visit: Payer: Self-pay

## 2018-12-26 DIAGNOSIS — R202 Paresthesia of skin: Secondary | ICD-10-CM | POA: Insufficient documentation

## 2018-12-26 DIAGNOSIS — Y999 Unspecified external cause status: Secondary | ICD-10-CM | POA: Diagnosis not present

## 2018-12-26 DIAGNOSIS — Z79899 Other long term (current) drug therapy: Secondary | ICD-10-CM | POA: Diagnosis not present

## 2018-12-26 DIAGNOSIS — S3992XA Unspecified injury of lower back, initial encounter: Secondary | ICD-10-CM | POA: Diagnosis present

## 2018-12-26 DIAGNOSIS — Y92410 Unspecified street and highway as the place of occurrence of the external cause: Secondary | ICD-10-CM | POA: Insufficient documentation

## 2018-12-26 DIAGNOSIS — Y9389 Activity, other specified: Secondary | ICD-10-CM | POA: Diagnosis not present

## 2018-12-26 DIAGNOSIS — S39012A Strain of muscle, fascia and tendon of lower back, initial encounter: Secondary | ICD-10-CM | POA: Diagnosis not present

## 2018-12-26 LAB — PREGNANCY, URINE: Preg Test, Ur: NEGATIVE

## 2018-12-26 MED ORDER — CYCLOBENZAPRINE HCL 10 MG PO TABS: 10.0000 mg | ORAL_TABLET | Freq: Two times a day (BID) | ORAL | 0 refills | Status: DC | PRN

## 2018-12-26 MED ORDER — HYDROCODONE-ACETAMINOPHEN 5-325 MG PO TABS: 1.0000 | ORAL_TABLET | Freq: Every evening | ORAL | 0 refills | Status: DC | PRN

## 2018-12-26 MED ORDER — HYDROCODONE-ACETAMINOPHEN 5-325 MG PO TABS
1.0000 | ORAL_TABLET | Freq: Once | ORAL | Status: AC
  Administered 2018-12-26: 1 via ORAL
  Filled 2018-12-26: qty 1
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
## 2018-12-26 NOTE — Discharge Instructions (Addendum)
You had a CT scan of your lower back performed today that showed a protruding disk at L3-L4. Please follow up with your doctor for recheck.

## 2018-12-26 NOTE — ED Notes (Addendum)
Pt ambulating to bathroom with slow steady  gait, declines bloodwork at this time.  Dr Ralene Bathe notified.  Pt denies dizziness with ambulation, does report continued pain with ambulation.
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
## 2021-05-24 ENCOUNTER — Emergency Department (HOSPITAL_BASED_OUTPATIENT_CLINIC_OR_DEPARTMENT_OTHER)
Admission: EM | Admit: 2021-05-24 | Discharge: 2021-05-24 | Disposition: A | Discharge status: Home / Self Care | Payer: No Typology Code available for payment source | Attending: Emergency Medicine | Admitting: Emergency Medicine

## 2021-05-24 ENCOUNTER — Other Ambulatory Visit: Payer: Self-pay

## 2021-05-24 ENCOUNTER — Encounter (HOSPITAL_BASED_OUTPATIENT_CLINIC_OR_DEPARTMENT_OTHER): Payer: Self-pay | Admitting: *Deleted

## 2021-05-24 DIAGNOSIS — K0889 Other specified disorders of teeth and supporting structures: Secondary | ICD-10-CM | POA: Diagnosis not present

## 2021-05-24 MED ORDER — MELOXICAM 15 MG PO TABS: 15.0000 mg | ORAL_TABLET | Freq: Every day | ORAL | 0 refills | Status: DC

## 2021-05-24 MED ORDER — PENICILLIN V POTASSIUM 250 MG PO TABS
500.0000 mg | ORAL_TABLET | Freq: Once | ORAL | Status: AC
  Administered 2021-05-24: 500 mg via ORAL
  Filled 2021-05-24: qty 2

## 2021-05-24 MED ORDER — PENICILLIN V POTASSIUM 500 MG PO TABS: 500.0000 mg | ORAL_TABLET | Freq: Four times a day (QID) | ORAL | 0 refills | Status: AC

## 2021-05-24 MED ORDER — OXYCODONE-ACETAMINOPHEN 5-325 MG PO TABS
2.0000 | ORAL_TABLET | Freq: Once | ORAL | Status: DC
  Filled 2021-05-24: qty 2

## 2021-05-24 MED ORDER — KETOROLAC TROMETHAMINE 60 MG/2ML IM SOLN
15.0000 mg | Freq: Once | INTRAMUSCULAR | Status: AC
  Administered 2021-05-24: 15 mg via INTRAMUSCULAR
  Filled 2021-05-24: qty 2

## 2021-05-24 NOTE — ED Provider Notes (Signed)
?[facility] EMERGENCY DEPARTMENT ?Provider Note ? ? ?CSN: [id number] ?Arrival date & time: 05/24/21  2334 ? ?  ? ?History ? ?Chief Complaint  ?Patient presents with  ? Dental Pain  ? ? ?Susan Hart is a 36 y.o. female. ? ?36 year old female whose had a few weeks of progressively worsening left lower dental pain after a broken tooth.  She pulsating sharp pain goes down her jaw and her chest into her whole body.  She states that is worse when she chews or is exposed to cold or hot air/liquids.  She is been trying to see a dentist but does not have the money to do so and her insurance is not accepted at many places.  Tried multiple over-the-counter medications to include Orajel and some type of cement.  Which is helped some but not consistently.  No trouble swallowing or breathing. ? ? ?Dental Pain ? ?  ? ?Home Medications ?Prior to Admission medications   ?Medication Sig Start Date End Date Taking? Authorizing Provider  ?meloxicam (MOBIC) 15 MG tablet Take 1 tablet (15 mg total) by mouth daily. 05/24/21  Yes Dajohn Ellender, Barbara Cower, MD  ?penicillin v potassium (VEETID) 500 MG tablet Take 1 tablet (500 mg total) by mouth 4 (four) times daily for 10 days. 05/24/21 06/03/21 Yes Mackinze Criado, Barbara Cower, MD  ?acetaminophen (TYLENOL) 500 MG tablet Take 1 tablet (500 mg total) by mouth every 6 (six) hours as needed. 12/20/18   Emi Holes, PA-C  ?cyclobenzaprine (FLEXERIL) 10 MG tablet Take 1 tablet (10 mg total) by mouth 2 (two) times daily as needed for muscle spasms. 12/26/18   Tilden Fossa, MD  ?flintstones complete (FLINTSTONES) 60 MG chewable tablet Chew 2 tablets by mouth every evening.    [provider]  ?HYDROcodone-acetaminophen (NORCO/VICODIN) 5-325 MG tablet Take 1 tablet by mouth at bedtime as needed for severe pain. 12/26/18   Tilden Fossa, MD  ?ibuprofen (ADVIL) 600 MG tablet Take 1 tablet (600 mg total) by mouth every 6 (six) hours as needed. 12/20/18   Emi Holes, PA-C  ?IRON PO Take 1 tablet by  mouth every evening.    [provider]  ?   ? ?Allergies    ?Diflucan [fluconazole]   ? ?Review of Systems   ?Review of Systems ? ?Physical Exam ?Updated Vital Signs ?BP 129/64   Pulse 60   Temp 98.1 ?F (36.7 ?C)   Resp 16   SpO2 100%  ?Physical Exam ?Vitals and nursing note reviewed.  ?Constitutional:   ?   Appearance: She is well-developed.  ?HENT:  ?   Head: Normocephalic and atraumatic.  ?   Mouth/Throat:  ?   Mouth: Mucous membranes are moist.  ?   Pharynx: Oropharynx is clear.  ? ?Eyes:  ?   Pupils: Pupils are equal, round, and reactive to light.  ?Cardiovascular:  ?   Rate and Rhythm: Normal rate and regular rhythm.  ?Pulmonary:  ?   Effort: Pulmonary effort is normal. No respiratory distress.  ?   Breath sounds: No stridor.  ?Abdominal:  ?   General: Abdomen is flat. There is no distension.  ?Musculoskeletal:  ?   Cervical back: Normal range of motion.  ?Skin: ?   General: Skin is warm and dry.  ?Neurological:  ?   General: No focal deficit present.  ?   Mental Status: She is alert.  ? ? ?ED Results / Procedures / Treatments   ?Labs ?(all labs ordered are listed, but only abnormal  results are displayed) ?Labs Reviewed - No data to display ? ?EKG ?None ? ?Radiology ?No results found. ? ?Procedures ?Procedures  ? ? ?Medications Ordered in ED ?Medications  ?ketorolac (TORADOL) injection 15 mg (has no administration in time range)  ?oxyCODONE-acetaminophen (PERCOCET/ROXICET) 5-325 MG per tablet 2 tablet (has no administration in time range)  ?penicillin v potassium (VEETID) tablet 500 mg (has no administration in time range)  ? ? ?ED Course/ Medical Decision Making/ A&P ?  ?                        ?Medical Decision Making ?Risk ?Prescription drug management. ? ? ?Broken tooth with some type of adhesives on the top of it.  She has some swelling and erythema around it.  Suspect that she likely has exposed nerve is causing most of her pain but probably dental infection as well. ? ?Final Clinical  Impression(s) / ED Diagnoses ?Final diagnoses:  ?Pain, dental  ? ? ?Rx / DC Orders ?ED Discharge Orders   ? ?      Ordered  ?  penicillin v potassium (VEETID) 500 MG tablet  4 times daily       ? 05/24/21 0607  ?  meloxicam (MOBIC) 15 MG tablet  Daily       ? 05/24/21 0607  ? ?  ?  ? ?  ? ? ?  ?Marily Memos, MD ?05/24/21 0814 ? ?

## 2021-05-24 NOTE — ED Triage Notes (Signed)
Pt states broken left lower tooth x 1 month with pain. Unable to see a dentist. Tylenol and ibuprofen does not help ? ?

## 2021-08-24 ENCOUNTER — Other Ambulatory Visit: Payer: Self-pay

## 2021-08-24 ENCOUNTER — Encounter (HOSPITAL_BASED_OUTPATIENT_CLINIC_OR_DEPARTMENT_OTHER): Payer: Self-pay | Admitting: Emergency Medicine

## 2021-08-24 DIAGNOSIS — M940 Chondrocostal junction syndrome [Tietze]: Secondary | ICD-10-CM | POA: Diagnosis not present

## 2021-08-24 DIAGNOSIS — E860 Dehydration: Secondary | ICD-10-CM | POA: Insufficient documentation

## 2021-08-24 DIAGNOSIS — R072 Precordial pain: Secondary | ICD-10-CM | POA: Diagnosis present

## 2021-08-24 LAB — URINALYSIS, ROUTINE W REFLEX MICROSCOPIC
Bilirubin Urine: NEGATIVE
Glucose, UA: NEGATIVE mg/dL
Hgb urine dipstick: NEGATIVE
Ketones, ur: NEGATIVE mg/dL
Leukocytes,Ua: NEGATIVE
Nitrite: NEGATIVE
Protein, ur: NEGATIVE mg/dL
Specific Gravity, Urine: >=1.03 (ref 1.005–1.030)
pH: 6 (ref 5.0–8.0)

## 2021-08-24 LAB — CBC
HCT: 36.1 % (ref 36.0–46.0)
Hemoglobin: 11.7 g/dL — ABNORMAL LOW (ref 12.0–15.0)
MCH: 25.8 pg — ABNORMAL LOW (ref 26.0–34.0)
MCHC: 32.4 g/dL (ref 30.0–36.0)
MCV: 79.7 fL — ABNORMAL LOW (ref 80.0–100.0)
Platelets: 320 10*3/uL (ref 150–400)
RBC: 4.53 MIL/uL (ref 3.87–5.11)
RDW: 15.7 % — ABNORMAL HIGH (ref 11.5–15.5)
WBC: 5.6 10*3/uL (ref 4.0–10.5)
nRBC: 0 % (ref 0.0–0.2)

## 2021-08-24 NOTE — ED Triage Notes (Signed)
Pt having lower chest/epigastric pain for one hour, radiating to her back.  No N/V.  States its hard to take a deep breath.  No recent travel, no hormone use.

## 2021-08-25 ENCOUNTER — Emergency Department (HOSPITAL_BASED_OUTPATIENT_CLINIC_OR_DEPARTMENT_OTHER): Payer: No Typology Code available for payment source

## 2021-08-25 ENCOUNTER — Encounter (HOSPITAL_BASED_OUTPATIENT_CLINIC_OR_DEPARTMENT_OTHER): Payer: Self-pay | Admitting: Emergency Medicine

## 2021-08-25 ENCOUNTER — Emergency Department (HOSPITAL_BASED_OUTPATIENT_CLINIC_OR_DEPARTMENT_OTHER)
Admission: EM | Admit: 2021-08-25 | Discharge: 2021-08-25 | Disposition: A | Discharge status: Home / Self Care | Payer: No Typology Code available for payment source | Attending: Emergency Medicine | Admitting: Emergency Medicine

## 2021-08-25 DIAGNOSIS — M940 Chondrocostal junction syndrome [Tietze]: Secondary | ICD-10-CM

## 2021-08-25 DIAGNOSIS — E860 Dehydration: Secondary | ICD-10-CM

## 2021-08-25 LAB — COMPREHENSIVE METABOLIC PANEL
ALT: 12 U/L (ref 0–44)
AST: 15 U/L (ref 15–41)
Albumin: 3.9 g/dL (ref 3.5–5.0)
Alkaline Phosphatase: 56 U/L (ref 38–126)
Anion gap: 3 — ABNORMAL LOW (ref 5–15)
BUN: 12 mg/dL (ref 6–20)
CO2: 30 mmol/L (ref 22–32)
Calcium: 9.1 mg/dL (ref 8.9–10.3)
Chloride: 106 mmol/L (ref 98–111)
Creatinine, Ser: 0.77 mg/dL (ref 0.44–1.00)
GFR, Estimated: >60 mL/min (ref >60)
Glucose, Bld: 82 mg/dL (ref 70–99)
Potassium: 3.4 mmol/L — ABNORMAL LOW (ref 3.5–5.1)
Sodium: 139 mmol/L (ref 135–145)
Total Bilirubin: 0.4 mg/dL (ref 0.3–1.2)
Total Protein: 7.6 g/dL (ref 6.5–8.1)

## 2021-08-25 LAB — TROPONIN I (HIGH SENSITIVITY): Troponin I (High Sensitivity): 3 ng/L (ref <18)

## 2021-08-25 LAB — D-DIMER, QUANTITATIVE: D-Dimer, Quant: 0.35 ug/mL-FEU (ref 0.00–0.50)

## 2021-08-25 LAB — HCG, SERUM, QUALITATIVE: Preg, Serum: NEGATIVE

## 2021-08-25 LAB — LIPASE, BLOOD: Lipase: 29 U/L (ref 11–51)

## 2021-08-25 MED ORDER — SODIUM CHLORIDE 0.9 % IV BOLUS
1000.0000 mL | Freq: Once | INTRAVENOUS | Status: AC
  Administered 2021-08-25: 1000 mL via INTRAVENOUS

## 2021-08-25 MED ORDER — KETOROLAC TROMETHAMINE 15 MG/ML IJ SOLN
15.0000 mg | Freq: Once | INTRAMUSCULAR | Status: AC
  Administered 2021-08-25: 15 mg via INTRAVENOUS
  Filled 2021-08-25: qty 1

## 2021-08-25 MED ORDER — NAPROXEN 375 MG PO TABS: ORAL_TABLET | ORAL | 0 refills | Status: DC

## 2021-08-25 NOTE — ED Provider Notes (Signed)
[facility] Provider Note: [name], MD, FACEP  CSN: [id number] MRN: [id number] ARRIVAL: 08/24/21 at 2316 ROOM: [facility]   CHIEF COMPLAINT  Chest Pain   HISTORY OF PRESENT ILLNESS  08/25/21 1:27 AM Susan Hart Susan Hart is a 36 y.o. female with chest pain that began about an hour prior to arrival.  The chest pain is sternal and radiates into her back.  It is worse with movement, palpation or deep breathing.  She feels it is hard to take a deep breath due to pain.  She is having no pain or swelling in her legs.  She rates her pain as a 10 out of 10 and describes it as sharp.   Past Medical History:  Diagnosis Date   Anxiety    just since being pregnant, 2015   SVD (spontaneous vaginal delivery) 05/12/2014    Past Surgical History:  Procedure Laterality Date   NO PAST SURGERIES      Family History  Adopted: Yes  Problem Relation Age of Onset   Alcohol abuse Neg Hx    Arthritis Neg Hx    Asthma Neg Hx    Birth defects Neg Hx    Cancer Neg Hx    COPD Neg Hx    Depression Neg Hx    Diabetes Neg Hx    Early death Neg Hx    Drug abuse Neg Hx    Hearing loss Neg Hx    Heart disease Neg Hx    Hyperlipidemia Neg Hx    Hypertension Neg Hx    Kidney disease Neg Hx    Learning disabilities Neg Hx    Mental illness Neg Hx    Mental retardation Neg Hx    Miscarriages / Stillbirths Neg Hx    Stroke Neg Hx    Vision loss Neg Hx    Varicose Veins Neg Hx     Social History   Tobacco Use   Smoking status: Never   Smokeless tobacco: Never  Substance Use Topics   Alcohol use: No   Drug use: No    Prior to Admission medications   Medication Sig Start Date End Date Taking? Authorizing Provider  naproxen (NAPROSYN) 375 MG tablet Take 1 tablet twice daily as needed for chest wall pain. 08/25/21  Yes Lillyana Majette, MD  flintstones complete (FLINTSTONES) 60 MG chewable tablet Chew 2 tablets by mouth every evening.    [provider]  IRON PO Take 1 tablet  by mouth every evening.    [provider]    Allergies Fluconazole   REVIEW OF SYSTEMS  Negative except as noted here or in the History of Present Illness.   PHYSICAL EXAMINATION  Initial Vital Signs Blood pressure 127/81, pulse 60, temperature 97.7 F (36.5 C), temperature source Oral, resp. rate 17, height 5\' 11"  (1.803 m), weight 88.5 kg, last menstrual period 08/19/2021, SpO2 99 %, unknown if currently breastfeeding.  Examination General: Well-developed, well-nourished female in no acute distress; appearance consistent with age of record HENT: normocephalic; atraumatic Eyes: Normal appearance Neck: supple Heart: regular rate and rhythm Lungs: clear to auscultation bilaterally; shallow breaths Chest: Bilateral parasternal tenderness Abdomen: soft; nondistended; nontender; bowel sounds present Extremities: No deformity; full range of motion; pulses normal; no edema Neurologic: Awake, alert and oriented; motor function intact in all extremities and symmetric; no facial droop Skin: Warm and dry Psychiatric: Normal mood and affect   RESULTS  Summary of this visit's results, reviewed and interpreted by myself:  EKG Interpretation  Date/Time:  Friday August 24 2021 23:24:31 EDT Ventricular Rate:  69 PR Interval:  204 QRS Duration: 78 QT Interval:  386 QTC Calculation: 413 R Axis:   64 Text Interpretation: Normal sinus rhythm Prolonged PR No previous ECGs available Confirmed by Paula Libra ([id number]) on 08/25/2021 1:27:03 AM       Laboratory Studies: Results for orders placed or performed during the hospital encounter of 08/25/21 (from the past 24 hour(s))  Lipase, blood     Status: None   Collection Time: 08/24/21 11:42 PM  Result Value Ref Range   Lipase 29 11 - 51 U/L  Comprehensive metabolic panel     Status: Abnormal   Collection Time: 08/24/21 11:42 PM  Result Value Ref Range   Sodium 139 135 - 145 mmol/L   Potassium 3.4 (L) 3.5 - 5.1 mmol/L    Chloride 106 98 - 111 mmol/L   CO2 30 22 - 32 mmol/L   Glucose, Bld 82 70 - 99 mg/dL   BUN 12 6 - 20 mg/dL   Creatinine, Ser 0.24 0.44 - 1.00 mg/dL   Calcium 9.1 8.9 - 09.7 mg/dL   Total Protein 7.6 6.5 - 8.1 g/dL   Albumin 3.9 3.5 - 5.0 g/dL   AST 15 15 - 41 U/L   ALT 12 0 - 44 U/L   Alkaline Phosphatase 56 38 - 126 U/L   Total Bilirubin 0.4 0.3 - 1.2 mg/dL   GFR, Estimated >35 >32 mL/min   Anion gap 3 (L) 5 - 15  CBC     Status: Abnormal   Collection Time: 08/24/21 11:42 PM  Result Value Ref Range   WBC 5.6 4.0 - 10.5 K/uL   RBC 4.53 3.87 - 5.11 MIL/uL   Hemoglobin 11.7 (L) 12.0 - 15.0 g/dL   HCT 99.2 42.6 - 83.4 %   MCV 79.7 (L) 80.0 - 100.0 fL   MCH 25.8 (L) 26.0 - 34.0 pg   MCHC 32.4 30.0 - 36.0 g/dL   RDW 19.6 (H) 22.2 - 97.9 %   Platelets 320 150 - 400 K/uL   nRBC 0.0 0.0 - 0.2 %  Urinalysis, Routine w reflex microscopic Urine, Clean Catch     Status: None   Collection Time: 08/24/21 11:42 PM  Result Value Ref Range   Color, Urine YELLOW YELLOW   APPearance CLEAR CLEAR   Specific Gravity, Urine >=1.030 1.005 - 1.030   pH 6.0 5.0 - 8.0   Glucose, UA NEGATIVE NEGATIVE mg/dL   Hgb urine dipstick NEGATIVE NEGATIVE   Bilirubin Urine NEGATIVE NEGATIVE   Ketones, ur NEGATIVE NEGATIVE mg/dL   Protein, ur NEGATIVE NEGATIVE mg/dL   Nitrite NEGATIVE NEGATIVE   Leukocytes,Ua NEGATIVE NEGATIVE  hCG, serum, qualitative     Status: None   Collection Time: 08/24/21 11:42 PM  Result Value Ref Range   Preg, Serum NEGATIVE NEGATIVE  Troponin I (High Sensitivity)     Status: None   Collection Time: 08/25/21  2:20 AM  Result Value Ref Range   Troponin I (High Sensitivity) 3 <18 ng/L  D-dimer, quantitative     Status: None   Collection Time: 08/25/21  2:20 AM  Result Value Ref Range   D-Dimer, Quant 0.35 0.00 - 0.50 ug/mL-FEU   Imaging Studies: DG Chest 2 View  Result Date: 08/25/2021 CLINICAL DATA:  Chest wall pain. Lower chest/epigastric pain radiating to back. EXAM: CHEST  - 2 VIEW COMPARISON:  03/07/2008. FINDINGS: The heart is borderline enlarged and the  mediastinal structures are within normal limits. No consolidation, effusion, or pneumothorax. No acute osseous abnormality. IMPRESSION: No active cardiopulmonary disease. Electronically Signed   By: Thornell Sartorius M.D.   On: 08/25/2021 02:10    ED COURSE and MDM  Nursing notes, initial and subsequent vitals signs, including pulse oximetry, reviewed and interpreted by myself.  Vitals:   08/25/21 0108 08/25/21 0212 08/25/21 0230 08/25/21 0332  BP: 127/81 132/82 130/74   Pulse: 60 (!) 52 (!) 52   Resp: 17 14 16    Temp:    97.8 F (36.6 C)  TempSrc:    Oral  SpO2: 99% 100% 100%   Weight:      Height:       Medications  sodium chloride 0.9 % bolus 1,000 mL (0 mLs Intravenous Stopped 08/25/21 0334)  ketorolac (TORADOL) 15 MG/ML injection 15 mg (15 mg Intravenous Given 08/25/21 0227)   3:30 AM Patient given IV fluid bolus for dehydration (urine specific gravity greater than 1.030).  D-dimer, troponin and EKG within normal limits.  I suspect this represents chest wall pain, likely costochondritis.  We will treat with naproxen.   PROCEDURES  Procedures   ED DIAGNOSES     ICD-10-CM   1. Costochondritis  M94.0     2. Dehydration  E86.0          Delanee Xin, 10/25/21, MD 08/25/21 [phone]

## 2022-01-21 DIAGNOSIS — B349 Viral infection, unspecified: Secondary | ICD-10-CM | POA: Diagnosis not present

## 2022-01-21 DIAGNOSIS — J040 Acute laryngitis: Secondary | ICD-10-CM | POA: Diagnosis not present

## 2022-01-28 DIAGNOSIS — R69 Illness, unspecified: Secondary | ICD-10-CM | POA: Diagnosis not present

## 2022-02-13 DIAGNOSIS — R000 Tachycardia, unspecified: Secondary | ICD-10-CM | POA: Diagnosis not present

## 2022-02-13 DIAGNOSIS — R053 Chronic cough: Secondary | ICD-10-CM | POA: Diagnosis not present

## 2022-03-01 ENCOUNTER — Emergency Department (HOSPITAL_BASED_OUTPATIENT_CLINIC_OR_DEPARTMENT_OTHER): Payer: 59

## 2022-03-01 ENCOUNTER — Encounter (HOSPITAL_BASED_OUTPATIENT_CLINIC_OR_DEPARTMENT_OTHER): Payer: Self-pay

## 2022-03-01 ENCOUNTER — Emergency Department (HOSPITAL_BASED_OUTPATIENT_CLINIC_OR_DEPARTMENT_OTHER)
Admission: EM | Admit: 2022-03-01 | Discharge: 2022-03-01 | Disposition: A | Discharge status: Home / Self Care | Payer: 59 | Attending: Emergency Medicine | Admitting: Emergency Medicine

## 2022-03-01 ENCOUNTER — Other Ambulatory Visit: Payer: Self-pay

## 2022-03-01 DIAGNOSIS — R7401 Elevation of levels of liver transaminase levels: Secondary | ICD-10-CM | POA: Insufficient documentation

## 2022-03-01 DIAGNOSIS — B9689 Other specified bacterial agents as the cause of diseases classified elsewhere: Secondary | ICD-10-CM

## 2022-03-01 DIAGNOSIS — K8020 Calculus of gallbladder without cholecystitis without obstruction: Secondary | ICD-10-CM | POA: Diagnosis not present

## 2022-03-01 DIAGNOSIS — N760 Acute vaginitis: Secondary | ICD-10-CM | POA: Insufficient documentation

## 2022-03-01 DIAGNOSIS — Z3202 Encounter for pregnancy test, result negative: Secondary | ICD-10-CM | POA: Diagnosis not present

## 2022-03-01 DIAGNOSIS — E876 Hypokalemia: Secondary | ICD-10-CM | POA: Diagnosis not present

## 2022-03-01 DIAGNOSIS — R102 Pelvic and perineal pain: Secondary | ICD-10-CM | POA: Diagnosis not present

## 2022-03-01 DIAGNOSIS — R1030 Lower abdominal pain, unspecified: Secondary | ICD-10-CM

## 2022-03-01 DIAGNOSIS — K769 Liver disease, unspecified: Secondary | ICD-10-CM | POA: Diagnosis not present

## 2022-03-01 DIAGNOSIS — Z113 Encounter for screening for infections with a predominantly sexual mode of transmission: Secondary | ICD-10-CM | POA: Diagnosis not present

## 2022-03-01 LAB — CBC
HCT: 38.9 % (ref 36.0–46.0)
Hemoglobin: 12.5 g/dL (ref 12.0–15.0)
MCH: 25.8 pg — ABNORMAL LOW (ref 26.0–34.0)
MCHC: 32.1 g/dL (ref 30.0–36.0)
MCV: 80.4 fL (ref 80.0–100.0)
Platelets: 277 10*3/uL (ref 150–400)
RBC: 4.84 MIL/uL (ref 3.87–5.11)
RDW: 16.1 % — ABNORMAL HIGH (ref 11.5–15.5)
WBC: 4.7 10*3/uL (ref 4.0–10.5)
nRBC: 0 % (ref 0.0–0.2)

## 2022-03-01 LAB — URINALYSIS, ROUTINE W REFLEX MICROSCOPIC
Bacteria, UA: NONE SEEN
Bilirubin Urine: NEGATIVE
Glucose, UA: NEGATIVE mg/dL
Hgb urine dipstick: NEGATIVE
Ketones, ur: NEGATIVE mg/dL
Nitrite: NEGATIVE
Protein, ur: NEGATIVE mg/dL
Specific Gravity, Urine: 1.014 (ref 1.005–1.030)
pH: 6.5 (ref 5.0–8.0)

## 2022-03-01 LAB — COMPREHENSIVE METABOLIC PANEL
ALT: 11 U/L (ref 0–44)
AST: 14 U/L — ABNORMAL LOW (ref 15–41)
Albumin: 4.3 g/dL (ref 3.5–5.0)
Alkaline Phosphatase: 63 U/L (ref 38–126)
Anion gap: 8 (ref 5–15)
BUN: 7 mg/dL (ref 6–20)
CO2: 29 mmol/L (ref 22–32)
Calcium: 9.4 mg/dL (ref 8.9–10.3)
Chloride: 105 mmol/L (ref 98–111)
Creatinine, Ser: 0.77 mg/dL (ref 0.44–1.00)
GFR, Estimated: >60 mL/min (ref >60)
Glucose, Bld: 86 mg/dL (ref 70–99)
Potassium: 3.4 mmol/L — ABNORMAL LOW (ref 3.5–5.1)
Sodium: 142 mmol/L (ref 135–145)
Total Bilirubin: 0.4 mg/dL (ref 0.3–1.2)
Total Protein: 7.8 g/dL (ref 6.5–8.1)

## 2022-03-01 LAB — WET PREP, GENITAL
Sperm: NONE SEEN
Trich, Wet Prep: NONE SEEN
WBC, Wet Prep HPF POC: >=10 — AB (ref <10)
Yeast Wet Prep HPF POC: NONE SEEN

## 2022-03-01 LAB — PREGNANCY, URINE: Preg Test, Ur: NEGATIVE

## 2022-03-01 LAB — LIPASE, BLOOD: Lipase: 24 U/L (ref 11–51)

## 2022-03-01 MED ORDER — KETOROLAC TROMETHAMINE 15 MG/ML IJ SOLN
15.0000 mg | Freq: Once | INTRAMUSCULAR | Status: AC
  Administered 2022-03-01: 15 mg via INTRAVENOUS
  Filled 2022-03-01: qty 1

## 2022-03-01 MED ORDER — POTASSIUM CHLORIDE CRYS ER 20 MEQ PO TBCR
20.0000 meq | EXTENDED_RELEASE_TABLET | Freq: Once | ORAL | Status: AC
  Administered 2022-03-01: 20 meq via ORAL
  Filled 2022-03-01: qty 1

## 2022-03-01 MED ORDER — METRONIDAZOLE 500 MG PO TABS: 500.0000 mg | ORAL_TABLET | Freq: Two times a day (BID) | ORAL | 0 refills | Status: DC

## 2022-03-01 MED ORDER — IOHEXOL 300 MG/ML  SOLN
100.0000 mL | Freq: Once | INTRAMUSCULAR | Status: AC | PRN
  Administered 2022-03-01: 80 mL via INTRAVENOUS

## 2022-03-01 MED ORDER — ONDANSETRON HCL 4 MG/2ML IJ SOLN
4.0000 mg | Freq: Once | INTRAMUSCULAR | Status: AC
  Administered 2022-03-01: 4 mg via INTRAVENOUS
  Filled 2022-03-01: qty 2

## 2022-03-01 NOTE — ED Notes (Signed)
Patient is pain free, nausea improved.

## 2022-03-01 NOTE — ED Notes (Signed)
Patient transported to CT 

## 2022-03-01 NOTE — ED Triage Notes (Signed)
Patient here POV from Home.  Endorses Lower ABD Pain that began 1 Week ago. Intermittent and Stabbing in nature.   Some Nausea. No Emesis or Diarrhea.  NAD Noted during Triage. A&Ox4. GCS 15. Ambulatory.

## 2022-03-01 NOTE — ED Provider Notes (Signed)
[facility] Provider Note   CSN: [id number] Arrival date & time: 03/01/22  1434     History Chief Complaint  Patient presents with   Abdominal Pain    Susan Hart is a 37 y.o. female otherwise healthy presents the emergency room today for evaluation of abdominal pain intermittently for the past week.  No exacerbating or relieving factors.  She has not tried any medications for this.  She reports some nausea but no vomiting.  She denies any dysuria, hematuria, vaginal discharge, vaginal bleeding, diarrhea, constipation, melena, hematochezia.  She denies any fever or recent cough or cold symptoms.  Denies any chest pain, shortness breath, rash, or joint swelling.  She recently went to [place] the 28th to the [place]st.  She also went to [place] the 3-6.  She reports that her pain started before she went to [place].  She denies any surgical history.  No daily medications.  Allergic to  Diflucan.  Denies any tobacco, EtOH, licit drug use ever.   Abdominal Pain Associated symptoms: no chest pain, no chills, no constipation, no diarrhea, no dysuria, no fever, no hematuria, no nausea, no shortness of breath, no vaginal bleeding, no vaginal discharge and no vomiting        Home Medications Prior to Admission medications   Medication Sig Start Date End Date Taking? Authorizing Provider  flintstones complete (FLINTSTONES) 60 MG chewable tablet Chew 2 tablets by mouth every evening.    [provider]  IRON PO Take 1 tablet by mouth every evening.    [provider]  naproxen (NAPROSYN) 375 MG tablet Take 1 tablet twice daily as needed for chest wall pain. 08/25/21   Molpus, John, MD      Allergies    Fluconazole    Review of Systems   Review of Systems  Constitutional:  Negative for chills and fever.  Respiratory:  Negative for shortness of breath.   Cardiovascular:  Negative for chest pain.  Gastrointestinal:  Positive for  abdominal pain. Negative for blood in stool, constipation, diarrhea, nausea and vomiting.  Genitourinary:  Negative for dysuria, flank pain, frequency, hematuria, urgency, vaginal bleeding and vaginal discharge.  Musculoskeletal:  Negative for back pain and joint swelling.    Physical Exam Updated Vital Signs BP (!) 129/58 (BP Location: Right Arm)   Pulse 84   Temp 98.3 F (36.8 C)   Resp 18   Ht 5' 11"$  (1.803 m)   Wt 88.5 kg   SpO2 100%   BMI 27.21 kg/m  Physical Exam Vitals and nursing note reviewed. Exam conducted with a chaperone present.  Constitutional:      General: She is not in acute distress.    Appearance: Normal appearance. She is not ill-appearing or toxic-appearing.  HENT:     Head: Normocephalic and atraumatic.     Mouth/Throat:     Mouth: Mucous membranes are moist.  Eyes:     General: No scleral icterus. Cardiovascular:     Rate and Rhythm: Normal rate and regular rhythm.  Pulmonary:     Effort: Pulmonary effort is normal. No respiratory distress.     Breath sounds: Normal breath sounds.  Abdominal:     General: Bowel sounds are normal. There is no distension.     Palpations: Abdomen is soft.     Tenderness: There is abdominal tenderness in the right lower quadrant, suprapubic area and left lower quadrant. There is no right CVA tenderness, left CVA tenderness, guarding  or rebound.  Genitourinary:    Vagina: Normal.     Cervix: No cervical motion tenderness.     Comments: Moderate amount of opaque white discharge Musculoskeletal:        General: No deformity.     Cervical back: Normal range of motion.  Skin:    General: Skin is warm and dry.  Neurological:     General: No focal deficit present.     Mental Status: She is alert. Mental status is at baseline.     ED Results / Procedures / Treatments   Labs (all labs ordered are listed, but only abnormal results are displayed) Labs Reviewed  COMPREHENSIVE METABOLIC PANEL - Abnormal; Notable for the  following components:      Result Value   Potassium 3.4 (*)    AST 14 (*)    All other components within normal limits  CBC - Abnormal; Notable for the following components:   MCH 25.8 (*)    RDW 16.1 (*)    All other components within normal limits  URINALYSIS, ROUTINE W REFLEX MICROSCOPIC - Abnormal; Notable for the following components:   APPearance HAZY (*)    Leukocytes,Ua SMALL (*)    All other components within normal limits  LIPASE, BLOOD  PREGNANCY, URINE    EKG None  Radiology US PELVIC COMPLETE W TRANSVAGINAL AND TORSION R/O  Result Date: 03/01/2022 CLINICAL DATA:  Generalized pelvic pain with nausea EXAM: TRANSABDOMINAL AND TRANSVAGINAL ULTRASOUND OF PELVIS TECHNIQUE: Both transabdominal and transvaginal ultrasound examinations of the pelvis were performed. Transabdominal technique was performed for global imaging of the pelvis including uterus, ovaries, adnexal regions, and pelvic cul-de-sac. It was necessary to proceed with endovaginal exam following the transabdominal exam to visualize the uterus and endometrium. COMPARISON:  CT 03/01/2022 FINDINGS: Uterus Measurements: 8.8 x 4.4 x 5.5 cm = volume: 111.8 mL. No fibroids or other mass visualized. Endometrium Thickness: 14 mm.  No focal abnormality visualized. Right ovary Not seen. Left ovary Not seen. Other findings No abnormal free fluid. IMPRESSION: 1. Nonvisualized ovaries.  Otherwise negative pelvic ultrasound Electronically Signed   By: Donavan Foil M.D.   On: 03/01/2022 17:37   CT ABDOMEN PELVIS W CONTRAST  Result Date: 03/01/2022 CLINICAL DATA:  Acute, lower abdominal pain EXAM: CT ABDOMEN AND PELVIS WITH CONTRAST TECHNIQUE: Multidetector CT imaging of the abdomen and pelvis was performed using the standard protocol following bolus administration of intravenous contrast. RADIATION DOSE REDUCTION: This exam was performed according to the departmental dose-optimization program which includes automated exposure control,  adjustment of the mA and/or kV according to patient size and/or use of iterative reconstruction technique. CONTRAST:  34m OMNIPAQUE IOHEXOL 300 MG/ML  SOLN COMPARISON:  None Available. FINDINGS: Lower chest: No acute abnormality. Hepatobiliary: Normal hepatic contour and morphology. Circumscribed 6 mm low-attenuation lesion in the superior aspect of segment 2 is too small for accurate characterization but is statistically highly likely a benign cyst. Small hyperattenuating focus in the gallbladder lumen likely represents cholelithiasis. No intra or extrahepatic biliary ductal dilatation. Pancreas: Unremarkable. No pancreatic ductal dilatation or surrounding inflammatory changes. Spleen: Normal in size without focal abnormality. Adrenals/Urinary Tract: Adrenal glands are unremarkable. Kidneys are normal, without renal calculi, focal lesion, or hydronephrosis. Bladder is unremarkable. Stomach/Bowel: Stomach is within normal limits. Appendix appears normal. No evidence of bowel wall thickening, distention, or inflammatory changes. Vascular/Lymphatic: No significant vascular findings are present. No enlarged abdominal or pelvic lymph nodes. Reproductive: Uterus and bilateral adnexa are unremarkable. Other: No abdominal wall hernia  or abnormality. No abdominopelvic ascites. Musculoskeletal: No acute fracture or aggressive appearing lytic or blastic osseous lesion. IMPRESSION: 1. No acute abnormality within the abdomen or pelvis. 2. Cholelithiasis. Electronically Signed   By: Jacqulynn Cadet M.D.   On: 03/01/2022 16:24    Procedures Procedures   Medications Ordered in ED Medications  potassium chloride SA (KLOR-CON M) CR tablet 20 mEq (has no administration in time range)  ketorolac (TORADOL) 15 MG/ML injection 15 mg (15 mg Intravenous Given 03/01/22 1600)  ondansetron (ZOFRAN) injection 4 mg (4 mg Intravenous Given 03/01/22 1600)  iohexol (OMNIPAQUE) 300 MG/ML solution 100 mL (80 mLs Intravenous Contrast Given  03/01/22 1620)    ED Course/ Medical Decision Making/ A&P                           Medical Decision Making Amount and/or Complexity of Data Reviewed Labs: ordered. Radiology: ordered.  Risk Prescription drug management.   37 year old female presents emerged part today for evaluation of lower abdominal pain intermittently.  Differential diagnosis includes but is not limited to AAA, mesenteric ischemia, appendicitis, diverticulitis, DKA, gastroenteritis, nephrolithiasis, pancreatitis, constipation, UTI, bowel obstruction, biliary disease, IBD, PUD, hepatitis, ectopic pregnancy, ovarian torsion, PID.  Vital signs show blood pressure at 129/58 otherwise afebrile, no pulse rate, satting well room air without increased work of breathing.  Physical exam as noted above.  Labs and imaging ordered.  Toradol given for pain and Zofran given for nausea.  I independent reviewed and interpreted the patient's labs.  CMP shows potassium at 3.4.  Mildly decreased AST at 14 otherwise no electrolyte or LFT abnormality.  Lipase within normal limits.  Pregnancy test is negative.  CBC shows no leukocytosis or anemia.  Urinalysis shows hazy urine with small amount of leukocytes however there is no nitrites, red blood cells, white blood cells, or bacteria and there is 11-20 squamous epithelials.  Likely dirty catch.  Patient does not have any urinary pain. Wet prep shows clue cells present.    CT abdomen shows 1. No acute abnormality within the abdomen or pelvis. 2. Cholelithiasis.   Ultrasound shows 1. Nonvisualized ovaries.  Otherwise negative pelvic ultrasound.  [place] was unable to see the ovaries because of the amount of bowel gas. Her ovaries are unremarkable on CT. The patient has been present for a week. I have a low suspicion for any TOA or torsion. CT and [place] otherwise negative. Lab work grossly unremarkable. Urine not consistent with UTI. Her wet prep does show clue cells and her physical exam showed moderate  amount of opaque white discharge. This is likely BV. No signs of obstruction, pancreatitis, or diverticulitis seen. The patient's vitals are stable. This could be lower cramping from mittelschmerz or bacterial vaginosis. At this time, the patient is well appearing and in no acute distress. I do not think she meets any criteria for  admission and can be discharged home. I recommended OTC Gas X for her symptoms are well.  We reviewed the labs and imaging at bedside. Discussed BV and the antibiotic to go with it. We discussed strict return precautions and red flag symptoms. The patient verbalized understanding and agrees to the plan. The patient is stable and being discharged home in good condition.    Final Clinical Impression(s) / ED Diagnoses Final diagnoses:  Lower abdominal pain  Bacterial vaginosis  Hypokalemia    Rx / DC Orders ED Discharge Orders  Ordered    metroNIDAZOLE (FLAGYL) 500 MG tablet  2 times daily        03/01/22 2033              Sherrell Puller, PA-C 03/03/22 1649    Drenda Freeze, MD 03/04/22 [phone]

## 2022-03-01 NOTE — Discharge Instructions (Addendum)
You were seen in the ER today for evaluation of your left lower abdominal pain.  Your CT did show some signs of gallstones however I did not think this was causing your pain.  I would follow-up with your primary care doctor for further monitoring of this.  Additionally, your lab work did show mildly decreased potassium otherwise everything else was unremarkable.  Your ultrasound did show you have a lot of bowel gas, this could be from some ear pain as well.  Given that you are having lower abdominal pain, the pelvic was performed.  Your wet prep showed bacterial vaginosis.  For this, you will need treatment with Flagyl twice a day for the next week.  DO NOT drink alcohol on this medication as will make you very ill.  If you have any concerns, new or worsening symptoms, please turn to the nearest emergency department for evaluation.  Contact a doctor if: Your belly pain changes or gets worse. You are not hungry, or you lose weight without trying. You are having trouble pooping (constipated) or have watery poop (diarrhea) for more than 2-3 days. You have pain when you pee or poop. Your belly pain wakes you up at night. Your pain gets worse with meals, after eating, or with certain foods. You are vomiting and cannot keep anything down. You have a fever. You have blood in your pee. Get help right away if: Your pain does not go away as soon as your doctor says it should. You cannot stop vomiting. Your pain is only in areas of your belly, such as the right side or the left lower part of the belly. You have bloody or black poop, or poop that looks like tar. You have very bad pain, cramping, or bloating in your belly. You have signs of not having enough fluid or water in your body (dehydration), such as: Dark pee, very little pee, or no pee. Cracked lips. Dry mouth. Sunken eyes. Sleepiness. Weakness. You have trouble breathing or chest pain.

## 2022-03-01 NOTE — ED Notes (Signed)
Patient transported to Ultrasound 

## 2022-03-01 NOTE — ED Notes (Signed)
Pt agreeable with d/c plan as discussed by provider- this nurse has verbally reinforced d/c instructions and provided pt with written copy.  Pt acknowledges verbal understanding and denies any addl questions, concerns, needs- pt ambulatory independently at d/c with steady gait; vitals stable; no distress.

## 2022-03-04 LAB — GC/CHLAMYDIA PROBE AMP (~~LOC~~) NOT AT ARMC
Chlamydia: NEGATIVE
Comment: NEGATIVE
Comment: NORMAL
Neisseria Gonorrhea: NEGATIVE

## 2022-03-13 DIAGNOSIS — Z30013 Encounter for initial prescription of injectable contraceptive: Secondary | ICD-10-CM | POA: Diagnosis not present

## 2022-04-01 DIAGNOSIS — F411 Generalized anxiety disorder: Secondary | ICD-10-CM | POA: Diagnosis not present

## 2022-04-29 DIAGNOSIS — F411 Generalized anxiety disorder: Secondary | ICD-10-CM | POA: Diagnosis not present

## 2022-05-21 DIAGNOSIS — Z30013 Encounter for initial prescription of injectable contraceptive: Secondary | ICD-10-CM | POA: Diagnosis not present

## 2022-06-03 DIAGNOSIS — F411 Generalized anxiety disorder: Secondary | ICD-10-CM | POA: Diagnosis not present

## 2022-07-08 DIAGNOSIS — F411 Generalized anxiety disorder: Secondary | ICD-10-CM | POA: Diagnosis not present

## 2022-08-05 DIAGNOSIS — F411 Generalized anxiety disorder: Secondary | ICD-10-CM | POA: Diagnosis not present

## 2022-08-06 DIAGNOSIS — Z30011 Encounter for initial prescription of contraceptive pills: Secondary | ICD-10-CM | POA: Diagnosis not present

## 2022-09-16 DIAGNOSIS — F411 Generalized anxiety disorder: Secondary | ICD-10-CM | POA: Diagnosis not present

## 2022-09-21 DIAGNOSIS — M79671 Pain in right foot: Secondary | ICD-10-CM | POA: Diagnosis not present

## 2022-09-21 DIAGNOSIS — M722 Plantar fascial fibromatosis: Secondary | ICD-10-CM | POA: Diagnosis not present

## 2022-10-16 DIAGNOSIS — R519 Headache, unspecified: Secondary | ICD-10-CM | POA: Diagnosis not present

## 2022-10-16 DIAGNOSIS — R252 Cramp and spasm: Secondary | ICD-10-CM | POA: Diagnosis not present

## 2022-10-20 ENCOUNTER — Other Ambulatory Visit: Payer: Self-pay

## 2022-10-20 ENCOUNTER — Encounter (HOSPITAL_BASED_OUTPATIENT_CLINIC_OR_DEPARTMENT_OTHER): Payer: Self-pay | Admitting: Emergency Medicine

## 2022-10-20 ENCOUNTER — Emergency Department (HOSPITAL_BASED_OUTPATIENT_CLINIC_OR_DEPARTMENT_OTHER)
Admission: EM | Admit: 2022-10-20 | Discharge: 2022-10-20 | Disposition: A | Discharge status: Home / Self Care | Payer: 59 | Attending: Emergency Medicine | Admitting: Emergency Medicine

## 2022-10-20 DIAGNOSIS — R42 Dizziness and giddiness: Secondary | ICD-10-CM | POA: Insufficient documentation

## 2022-10-20 DIAGNOSIS — F419 Anxiety disorder, unspecified: Secondary | ICD-10-CM | POA: Diagnosis not present

## 2022-10-20 DIAGNOSIS — R030 Elevated blood-pressure reading, without diagnosis of hypertension: Secondary | ICD-10-CM | POA: Diagnosis not present

## 2022-10-20 DIAGNOSIS — R519 Headache, unspecified: Secondary | ICD-10-CM | POA: Insufficient documentation

## 2022-10-20 LAB — URINALYSIS, ROUTINE W REFLEX MICROSCOPIC
Bacteria, UA: NONE SEEN
Bilirubin Urine: NEGATIVE
Glucose, UA: NEGATIVE mg/dL
Ketones, ur: NEGATIVE mg/dL
Nitrite: NEGATIVE
Specific Gravity, Urine: 1.019 (ref 1.005–1.030)
pH: 7 (ref 5.0–8.0)

## 2022-10-20 LAB — COMPREHENSIVE METABOLIC PANEL
ALT: 12 U/L (ref 0–44)
AST: 11 U/L — ABNORMAL LOW (ref 15–41)
Albumin: 3.9 g/dL (ref 3.5–5.0)
Alkaline Phosphatase: 50 U/L (ref 38–126)
Anion gap: 8 (ref 5–15)
BUN: 7 mg/dL (ref 6–20)
CO2: 25 mmol/L (ref 22–32)
Calcium: 8.9 mg/dL (ref 8.9–10.3)
Chloride: 106 mmol/L (ref 98–111)
Creatinine, Ser: 0.71 mg/dL (ref 0.44–1.00)
GFR, Estimated: >60 mL/min (ref >60)
Glucose, Bld: 80 mg/dL (ref 70–99)
Potassium: 3.8 mmol/L (ref 3.5–5.1)
Sodium: 139 mmol/L (ref 135–145)
Total Bilirubin: 0.3 mg/dL (ref 0.3–1.2)
Total Protein: 7.1 g/dL (ref 6.5–8.1)

## 2022-10-20 LAB — CBC WITH DIFFERENTIAL/PLATELET
Abs Immature Granulocytes: 0.01 10*3/uL (ref 0.00–0.07)
Basophils Absolute: 0 10*3/uL (ref 0.0–0.1)
Basophils Relative: 0 %
Eosinophils Absolute: 0 10*3/uL (ref 0.0–0.5)
Eosinophils Relative: 1 %
HCT: 38.3 % (ref 36.0–46.0)
Hemoglobin: 12.7 g/dL (ref 12.0–15.0)
Immature Granulocytes: 0 %
Lymphocytes Relative: 55 %
Lymphs Abs: 2.8 10*3/uL (ref 0.7–4.0)
MCH: 25.6 pg — ABNORMAL LOW (ref 26.0–34.0)
MCHC: 33.2 g/dL (ref 30.0–36.0)
MCV: 77.2 fL — ABNORMAL LOW (ref 80.0–100.0)
Monocytes Absolute: 0.4 10*3/uL (ref 0.1–1.0)
Monocytes Relative: 7 %
Neutro Abs: 1.9 10*3/uL (ref 1.7–7.7)
Neutrophils Relative %: 37 %
Platelets: 314 10*3/uL (ref 150–400)
RBC: 4.96 MIL/uL (ref 3.87–5.11)
RDW: 14.9 % (ref 11.5–15.5)
WBC: 5.2 10*3/uL (ref 4.0–10.5)
nRBC: 0 % (ref 0.0–0.2)

## 2022-10-20 LAB — PREGNANCY, URINE: Preg Test, Ur: NEGATIVE

## 2022-10-20 MED ORDER — IBUPROFEN 400 MG PO TABS
600.0000 mg | ORAL_TABLET | Freq: Once | ORAL | Status: DC
  Filled 2022-10-20: qty 1

## 2022-10-20 MED ORDER — ACETAMINOPHEN 500 MG PO TABS
1000.0000 mg | ORAL_TABLET | Freq: Once | ORAL | Status: DC
  Filled 2022-10-20: qty 2

## 2022-10-20 MED ORDER — HYDROXYZINE HCL 25 MG PO TABS: 25.0000 mg | ORAL_TABLET | Freq: Four times a day (QID) | ORAL | 0 refills | Status: DC

## 2022-10-20 MED ORDER — HYDROXYZINE HCL 25 MG PO TABS
25.0000 mg | ORAL_TABLET | Freq: Once | ORAL | Status: AC
  Administered 2022-10-20: 25 mg via ORAL
  Filled 2022-10-20: qty 1

## 2022-10-20 MED ORDER — LACTATED RINGERS IV BOLUS: 1000.0000 mL | Freq: Once | INTRAVENOUS | Status: DC

## 2022-10-20 NOTE — ED Triage Notes (Signed)
Dizziness and headache x 3 weeks. Felt dizzy at church, check BP  135/91 concerned about her high BP. Seen at [facility] on Wednesday for same

## 2022-10-20 NOTE — ED Provider Notes (Signed)
[place] EMERGENCY DEPARTMENT AT [facility] Provider Note   CSN: [id number] Arrival date & time: 10/20/22  1140     History Chief Complaint  Patient presents with   Dizziness    Susan Hart is a 37 y.o. female with history of anxiety presents emerged from today for evaluation of elevated blood pressure associated with headaches.  Patient reports that her blood pressure is usually in the 110s and has been up into the 130s systolically.  She reports that whenever she has a headache it usually last between a few minutes and maximum 10 minutes.  She reports its mainly in the front and feels pressure-like in sensation.  She denies any vision changes reports she does feel a little woozy/lightheaded.  Denies any room spinning sensation.  She denies any syncopal events.  She denies any trouble walking or talking.  She denies any weakness.  She denies any chest pain or shortness of breath or any visual changes.  She reports these headaches have been going intermittently for the past 3 weeks and reports that she checks her blood pressure after the headaches and notices it is mildly elevated however has not whenever she is not having the headaches.  This is concerning for her.  She also reports that she thinks her anxiety is contributing to her symptoms.  She reports that she has a primary care appointment on 10-29-2022.  She denies any SI or HI.  She is concerned with her blood pressure being in the 130s given that this is elevated for her with a headache and was concerned that she may have a stroke.  She was already seen by urgent care for this and have lab work ordered and was educated on returning to the emergency department.  No known drug allergies.  Denies any tobacco, EtOH illicit drug use.   Dizziness Associated symptoms: headaches   Associated symptoms: no chest pain, no nausea, no palpitations, no shortness of breath, no vomiting and no weakness        Home Medications Prior to  Admission medications   Medication Sig Start Date End Date Taking? Authorizing Provider  flintstones complete (FLINTSTONES) 60 MG chewable tablet Chew 2 tablets by mouth every evening.    [provider]  IRON PO Take 1 tablet by mouth every evening.    [provider]  metroNIDAZOLE (FLAGYL) 500 MG tablet Take 1 tablet (500 mg total) by mouth 2 (two) times daily. 03/01/22   Achille Rich, PA-C  naproxen (NAPROSYN) 375 MG tablet Take 1 tablet twice daily as needed for chest wall pain. 08/25/21   Molpus, John, MD      Allergies    Fluconazole    Review of Systems   Review of Systems  Constitutional:  Negative for chills and fever.  HENT:  Negative for congestion and rhinorrhea.   Respiratory:  Negative for cough and shortness of breath.   Cardiovascular:  Negative for chest pain and palpitations.  Gastrointestinal:  Negative for abdominal pain, nausea and vomiting.  Musculoskeletal:  Negative for gait problem, neck pain and neck stiffness.  Neurological:  Positive for light-headedness and headaches. Negative for syncope, speech difficulty and weakness.  Psychiatric/Behavioral:  The patient is nervous/anxious.     Physical Exam Updated Vital Signs BP 113/61   Pulse (!) 54   Temp 98.2 F (36.8 C)   Resp 13   SpO2 100%  Physical Exam Vitals and nursing note reviewed.  Constitutional:      General: She  is not in acute distress.    Appearance: Normal appearance. She is not ill-appearing or toxic-appearing.  HENT:     Mouth/Throat:     Mouth: Mucous membranes are moist.  Eyes:     General: No scleral icterus.    Extraocular Movements: Extraocular movements intact.     Pupils: Pupils are equal, round, and reactive to light.  Cardiovascular:     Rate and Rhythm: Normal rate and regular rhythm.  Pulmonary:     Effort: Pulmonary effort is normal. No respiratory distress.     Breath sounds: Normal breath sounds.  Abdominal:     General: Bowel sounds are normal.      Palpations: Abdomen is soft.     Tenderness: There is no abdominal tenderness. There is no guarding or rebound.  Musculoskeletal:        General: No deformity.     Cervical back: Normal range of motion. No rigidity or tenderness.     Right lower leg: No edema.     Left lower leg: No edema.  Lymphadenopathy:     Cervical: No cervical adenopathy.  Skin:    General: Skin is warm and dry.  Neurological:     General: No focal deficit present.     Mental Status: She is alert. Mental status is at baseline.     GCS: GCS eye subscore is 4. GCS verbal subscore is 5. GCS motor subscore is 6.     Cranial Nerves: No cranial nerve deficit, dysarthria or facial asymmetry.     Sensory: No sensory deficit.     Motor: No weakness or pronator drift.     Coordination: Coordination normal. Finger-Nose-Finger Test normal.     Gait: Gait normal.     ED Results / Procedures / Treatments   Labs (all labs ordered are listed, but only abnormal results are displayed) Labs Reviewed  URINALYSIS, ROUTINE W REFLEX MICROSCOPIC - Abnormal; Notable for the following components:      Result Value   Color, Urine COLORLESS (*)    Hgb urine dipstick LARGE (*)    Protein, ur TRACE (*)    Leukocytes,Ua TRACE (*)    All other components within normal limits  COMPREHENSIVE METABOLIC PANEL - Abnormal; Notable for the following components:   AST 11 (*)    All other components within normal limits  CBC WITH DIFFERENTIAL/PLATELET - Abnormal; Notable for the following components:   MCV 77.2 (*)    MCH 25.6 (*)    All other components within normal limits  PREGNANCY, URINE    EKG EKG Interpretation Date/Time:  Sunday October 20 2022 12:27:10 EDT Ventricular Rate:  62 PR Interval:  206 QRS Duration:  89 QT Interval:  385 QTC Calculation: 391 R Axis:   35  Text Interpretation: Sinus rhythm Borderline prolonged PR interval Confirmed by Margarita Grizzle [phone]) on 10/20/2022 1:08:51 PM  Radiology No results  found.  Procedures Procedures   Medications Ordered in ED Medications  acetaminophen (TYLENOL) tablet 1,000 mg (1,000 mg Oral Not Given 10/20/22 1532)  ibuprofen (ADVIL) tablet 600 mg (600 mg Oral Not Given 10/20/22 1533)  hydrOXYzine (ATARAX) tablet 25 mg (25 mg Oral Given 10/20/22 1618)    ED Course/ Medical Decision Making/ A&P                               Medical Decision Making Amount and/or Complexity of Data Reviewed Labs: ordered.  Risk OTC  drugs. Prescription drug management.   37 y.o. female presents to the ER for evaluation of headache and elevated blood pressure. Differential diagnosis includes but is not limited to Stroke, increased ICP, meningitis, CVA, intracranial tumor, venous sinus thrombosis, migraine, cluster headache, hypertension, drug related, head injury, tension headache, sinusitis, dental abscess, otitis media, TMJ. Vital signs unremarkable. Physical exam as noted above.   On previous chart evaluation, patient was seen in urgent care on 10/16/22 for same symptoms.  She was told to follow-up with her PCP.  They performed lab such as TSH, vitamin D, and point-of-care metabolic panel.  Her blood count was stable and TSH is within normal limits per the note.  Vitamin D was low and they recommended taking over-the-counter vitamin D supplements. CMP did show an elevation at 150 in sodium. Will re-check.   I independently reviewed and interpreted the patient's labs.  CMP shows mildly decreased AST at 11 otherwise no electrolyte or LFT abnormality.  CBC without leukocytosis or anemia.  Pregnancy test is negative.  Urinalysis shows large amount of hemoglobin present however patient is currently on her cycle.  Trace Mehta leukocytes but no bacteria, white blood cells, or nitrites present.  Trace protein.  Colorless urine.  Patient declined CT imaging of her head.  Overall, the patient is a benign neurological exam.  She does not have any nuchal rigidity.  The headaches  are short-lived and happen whenever she is stressed or anxious.  Lasting anywhere from a few minutes to 10 to 20 minutes.  She is not having any runny nose or nasal congestion.  She is not having any dental pain.  Her blood pressures have been within normal limits here and are not critically elevated.  I have a lower system for any stroke, meningitis, or hypertensive emergency.  She is likely having some tension headaches.  She is declined a migraine cocktail however reports that she would take something for her anxiety.  Hydroxyzine ordered.  We discussed the results of the labs/imaging. The plan is follow-up with PCP, stable hydrated, take medication as needed. We discussed strict return precautions and red flag symptoms. The patient verbalized their understanding and agrees to the plan. The patient is stable and being discharged home in good condition.  Portions of this report may have been transcribed using voice recognition software. Every effort was made to ensure accuracy; however, inadvertent computerized transcription errors may be present.   Final Clinical Impression(s) / ED Diagnoses Final diagnoses:  Anxiety  Generalized headache    Rx / DC Orders ED Discharge Orders          Ordered    hydrOXYzine (ATARAX) 25 MG tablet  Every 6 hours,   Status:  Discontinued        10/20/22 1620    hydrOXYzine (ATARAX) 25 MG tablet  Every 6 hours        10/20/22 1631              Achille Rich, PA-C 10/22/22 0007    Margarita Grizzle, MD 10/31/22 1227

## 2022-10-20 NOTE — Discharge Instructions (Addendum)
You were seen in the ER for evaluation of your headache. This may be due to stress. You have declined CT imaging here. Please make sure that you are staying well hydrated, drinking plenty of fluids, mainly water.  He can also try Tylenol or Profen for headaches or he can also try Excedrin as well.  Additionally, I am going to send you in a few medication called hydroxyzine which she should take as needed for anxiety.  Be careful as this can make you sleepy.  Please make sure you are following up with your PCP for reevaluation of this.  If you have any concerns of any worsening symptoms, please return to your nearest emergency department for evaluation.  Contact a doctor if: Medicine does not help your symptoms. You have a headache that feels different than the other headaches. You feel like you may vomit (nauseous) or you vomit. You have a fever. Get help right away if: Your headache: Gets very bad quickly. Gets worse after a lot of physical activity. You have any of these symptoms: You continue to vomit. A stiff neck. Trouble seeing. Your eye or ear hurts. Trouble speaking. Weak muscles or you lose muscle control. You lose your balance or have trouble walking. You feel like you will pass out (faint) or you pass out. You are mixed up (confused). You have a seizure. These symptoms may be an emergency. Get help right away. Call your local emergency services (911 in the [place] Do not wait to see if the symptoms will go away. Do not drive yourself to the hospital.

## 2022-10-21 DIAGNOSIS — F411 Generalized anxiety disorder: Secondary | ICD-10-CM | POA: Diagnosis not present

## 2022-10-22 NOTE — ED Provider Notes (Incomplete)
[place] [facility]PARTMENT AT [facility] Provider Note   CSN: [id number] Arrival date & time: 10/20/22  1140     History Chief Complaint  Patient presents with  . Dizziness    Susan Hart is a 37 y.o. female with history of anxiety presents emerged from today for evaluation of   Dizziness      Home Medications Prior to Admission medications   Medication Sig Start Date End Date Taking? Authorizing Provider  flintstones complete (FLINTSTONES) 60 MG chewable tablet Chew 2 tablets by mouth every evening.    [provider]  IRON PO Take 1 tablet by mouth every evening.    [provider]  metroNIDAZOLE (FLAGYL) 500 MG tablet Take 1 tablet (500 mg total) by mouth 2 (two) times daily. 03/01/22   Achille Rich, PA-C  naproxen (NAPROSYN) 375 MG tablet Take 1 tablet twice daily as needed for chest wall pain. 08/25/21   Molpus, Jonny Ruiz, MD      Allergies    Fluconazole    Review of Systems   Review of Systems  Neurological:  Positive for dizziness.    Physical Exam Updated Vital Signs BP 113/61   Pulse (!) 54   Temp 98.2 F (36.8 C)   Resp 13   SpO2 100%  Physical Exam Vitals and nursing note reviewed.  Constitutional:      General: She is not in acute distress.    Appearance: Normal appearance. She is not ill-appearing or toxic-appearing.  HENT:     Mouth/Throat:     Mouth: Mucous membranes are moist.  Eyes:     General: No scleral icterus.    Extraocular Movements: Extraocular movements intact.     Pupils: Pupils are equal, round, and reactive to light.  Cardiovascular:     Rate and Rhythm: Normal rate and regular rhythm.  Pulmonary:     Effort: Pulmonary effort is normal. No respiratory distress.     Breath sounds: Normal breath sounds.  Abdominal:     General: Bowel sounds are normal.     Palpations: Abdomen is soft.     Tenderness: There is no abdominal tenderness. There is no guarding or rebound.  Musculoskeletal:         General: No deformity.     Cervical back: Normal range of motion. No rigidity or tenderness.     Right lower leg: No edema.     Left lower leg: No edema.  Lymphadenopathy:     Cervical: No cervical adenopathy.  Skin:    General: Skin is warm and dry.  Neurological:     General: No focal deficit present.     Mental Status: She is alert. Mental status is at baseline.     GCS: GCS eye subscore is 4. GCS verbal subscore is 5. GCS motor subscore is 6.     Cranial Nerves: No cranial nerve deficit, dysarthria or facial asymmetry.     Sensory: No sensory deficit.     Motor: No weakness or pronator drift.     Coordination: Coordination normal. Finger-Nose-Finger Test normal.     Gait: Gait normal.     ED Results / Procedures / Treatments   Labs (all labs ordered are listed, but only abnormal results are displayed) Labs Reviewed  URINALYSIS, ROUTINE W REFLEX MICROSCOPIC - Abnormal; Notable for the following components:      Result Value   Color, Urine COLORLESS (*)    Hgb urine dipstick LARGE (*)    Protein, ur  TRACE (*)    Leukocytes,Ua TRACE (*)    All other components within normal limits  COMPREHENSIVE METABOLIC PANEL - Abnormal; Notable for the following components:   AST 11 (*)    All other components within normal limits  CBC WITH DIFFERENTIAL/PLATELET - Abnormal; Notable for the following components:   MCV 77.2 (*)    MCH 25.6 (*)    All other components within normal limits  PREGNANCY, URINE    EKG EKG Interpretation Date/Time:  Sunday October 20 2022 12:27:10 EDT Ventricular Rate:  62 PR Interval:  206 QRS Duration:  89 QT Interval:  385 QTC Calculation: 391 R Axis:   35  Text Interpretation: Sinus rhythm Borderline prolonged PR interval Confirmed by Margarita Grizzle [phone]) on 10/20/2022 1:08:51 PM  Radiology No results found.  Procedures Procedures   Medications Ordered in ED Medications  acetaminophen (TYLENOL) tablet 1,000 mg (1,000 mg Oral Not Given  10/20/22 1532)  ibuprofen (ADVIL) tablet 600 mg (600 mg Oral Not Given 10/20/22 1533)  hydrOXYzine (ATARAX) tablet 25 mg (25 mg Oral Given 10/20/22 1618)    ED Course/ Medical Decision Making/ A&P                               Medical Decision Making Amount and/or Complexity of Data Reviewed Labs: ordered.  Risk OTC drugs. Prescription drug management.   37 y.o. female presents to the ER for evaluation of headache and elevated blood pressure. Differential diagnosis includes but is not limited to Stroke, increased ICP, meningitis, CVA, intracranial tumor, venous sinus thrombosis, migraine, cluster headache, hypertension, drug related, head injury, tension headache, sinusitis, dental abscess, otitis media, TMJ. Vital signs unremarkable. Physical exam as noted above.   On previous chart evaluation, patient was seen in urgent care on 10/16/22 for same symptoms.  She was told to follow-up with her PCP.  They performed lab such as TSH, vitamin D, and point-of-care metabolic panel.  Her blood count was stable and TSH is within normal limits per the note.  Vitamin D was low and they recommended taking over-the-counter vitamin D supplements. CMP did show an elevation at 150 in sodium. Will re-check.   I independently reviewed and interpreted the patient's labs.  CMP shows mildly decreased AST at 11 otherwise no electrolyte or LFT abnormality.  CBC without leukocytosis or anemia.  Pregnancy test is negative.  Urinalysis shows large amount of hemoglobin present however patient is currently on her cycle.  Trace Mehta leukocytes but no bacteria, white blood cells, or nitrites present.  Trace protein.  Colorless urine.  Patient declined CT imaging of her head.  Overall, the patient is a benign neurological exam.  She does not have any nuchal rigidity.  The headaches are short-lived and happen whenever she is stressed or anxious.  Lasting anywhere from a few minutes to 10 to 20 minutes.  She is not having any  runny nose or nasal congestion.  She is not having any dental pain.  Her blood pressures have been within normal limits here and are not critically elevated.  I have a lower system for any stroke, meningitis, or hypertensive emergency.  She is likely having some tension headaches.  She is declined a migraine cocktail however reports that she would take something for her anxiety.  Hydroxyzine ordered.  We discussed the results of the labs/imaging. The plan is follow-up with PCP, stable hydrated, take medication as needed. We discussed strict return precautions  and red flag symptoms. The patient verbalized their understanding and agrees to the plan. The patient is stable and being discharged home in good condition.  Portions of this report may have been transcribed using voice recognition software. Every effort was made to ensure accuracy; however, inadvertent computerized transcription errors may be present.   Final Clinical Impression(s) / ED Diagnoses Final diagnoses:  Anxiety  Generalized headache    Rx / DC Orders ED Discharge Orders     None

## 2022-10-29 DIAGNOSIS — F410 Panic disorder [episodic paroxysmal anxiety] without agoraphobia: Secondary | ICD-10-CM | POA: Diagnosis not present

## 2022-10-29 DIAGNOSIS — E559 Vitamin D deficiency, unspecified: Secondary | ICD-10-CM | POA: Diagnosis not present

## 2022-10-29 DIAGNOSIS — F411 Generalized anxiety disorder: Secondary | ICD-10-CM | POA: Diagnosis not present

## 2022-10-29 DIAGNOSIS — M79671 Pain in right foot: Secondary | ICD-10-CM | POA: Diagnosis not present

## 2022-10-29 DIAGNOSIS — F331 Major depressive disorder, recurrent, moderate: Secondary | ICD-10-CM | POA: Diagnosis not present

## 2022-10-29 DIAGNOSIS — N926 Irregular menstruation, unspecified: Secondary | ICD-10-CM | POA: Diagnosis not present

## 2022-10-29 DIAGNOSIS — E538 Deficiency of other specified B group vitamins: Secondary | ICD-10-CM | POA: Diagnosis not present

## 2022-11-22 DIAGNOSIS — Z719 Counseling, unspecified: Secondary | ICD-10-CM | POA: Diagnosis not present

## 2022-11-26 DIAGNOSIS — E538 Deficiency of other specified B group vitamins: Secondary | ICD-10-CM | POA: Diagnosis not present

## 2022-11-26 DIAGNOSIS — E559 Vitamin D deficiency, unspecified: Secondary | ICD-10-CM | POA: Diagnosis not present

## 2022-11-26 DIAGNOSIS — F410 Panic disorder [episodic paroxysmal anxiety] without agoraphobia: Secondary | ICD-10-CM | POA: Diagnosis not present

## 2022-11-26 DIAGNOSIS — F411 Generalized anxiety disorder: Secondary | ICD-10-CM | POA: Diagnosis not present

## 2022-11-26 DIAGNOSIS — F3341 Major depressive disorder, recurrent, in partial remission: Secondary | ICD-10-CM | POA: Diagnosis not present

## 2022-12-02 DIAGNOSIS — M6702 Short Achilles tendon (acquired), left ankle: Secondary | ICD-10-CM | POA: Diagnosis not present

## 2022-12-02 DIAGNOSIS — M722 Plantar fascial fibromatosis: Secondary | ICD-10-CM | POA: Diagnosis not present

## 2022-12-02 DIAGNOSIS — M6701 Short Achilles tendon (acquired), right ankle: Secondary | ICD-10-CM | POA: Diagnosis not present

## 2022-12-09 DIAGNOSIS — Z0000 Encounter for general adult medical examination without abnormal findings: Secondary | ICD-10-CM | POA: Diagnosis not present

## 2022-12-09 DIAGNOSIS — R87610 Atypical squamous cells of undetermined significance on cytologic smear of cervix (ASC-US): Secondary | ICD-10-CM | POA: Diagnosis not present

## 2022-12-09 DIAGNOSIS — Z124 Encounter for screening for malignant neoplasm of cervix: Secondary | ICD-10-CM | POA: Diagnosis not present

## 2022-12-09 DIAGNOSIS — Z202 Contact with and (suspected) exposure to infections with a predominantly sexual mode of transmission: Secondary | ICD-10-CM | POA: Diagnosis not present

## 2022-12-09 DIAGNOSIS — Z01419 Encounter for gynecological examination (general) (routine) without abnormal findings: Secondary | ICD-10-CM | POA: Diagnosis not present

## 2022-12-19 DIAGNOSIS — J208 Acute bronchitis due to other specified organisms: Secondary | ICD-10-CM | POA: Diagnosis not present

## 2022-12-19 DIAGNOSIS — Z20822 Contact with and (suspected) exposure to covid-19: Secondary | ICD-10-CM | POA: Diagnosis not present

## 2022-12-19 DIAGNOSIS — R062 Wheezing: Secondary | ICD-10-CM | POA: Diagnosis not present

## 2022-12-19 DIAGNOSIS — B9689 Other specified bacterial agents as the cause of diseases classified elsewhere: Secondary | ICD-10-CM | POA: Diagnosis not present

## 2022-12-19 DIAGNOSIS — I498 Other specified cardiac arrhythmias: Secondary | ICD-10-CM | POA: Diagnosis not present

## 2022-12-20 DIAGNOSIS — I498 Other specified cardiac arrhythmias: Secondary | ICD-10-CM | POA: Diagnosis not present

## 2022-12-26 DIAGNOSIS — R062 Wheezing: Secondary | ICD-10-CM | POA: Diagnosis not present

## 2022-12-26 DIAGNOSIS — R002 Palpitations: Secondary | ICD-10-CM | POA: Diagnosis not present

## 2022-12-26 DIAGNOSIS — R87610 Atypical squamous cells of undetermined significance on cytologic smear of cervix (ASC-US): Secondary | ICD-10-CM | POA: Diagnosis not present

## 2022-12-26 DIAGNOSIS — R0602 Shortness of breath: Secondary | ICD-10-CM | POA: Diagnosis not present

## 2022-12-26 DIAGNOSIS — R87618 Other abnormal cytological findings on specimens from cervix uteri: Secondary | ICD-10-CM | POA: Diagnosis not present

## 2023-01-17 DIAGNOSIS — R87810 Cervical high risk human papillomavirus (HPV) DNA test positive: Secondary | ICD-10-CM | POA: Diagnosis not present

## 2023-01-17 DIAGNOSIS — N72 Inflammatory disease of cervix uteri: Secondary | ICD-10-CM | POA: Diagnosis not present

## 2023-01-17 DIAGNOSIS — R87610 Atypical squamous cells of undetermined significance on cytologic smear of cervix (ASC-US): Secondary | ICD-10-CM | POA: Diagnosis not present

## 2023-01-17 DIAGNOSIS — Z3202 Encounter for pregnancy test, result negative: Secondary | ICD-10-CM | POA: Diagnosis not present

## 2023-01-28 DIAGNOSIS — Z23 Encounter for immunization: Secondary | ICD-10-CM | POA: Diagnosis not present

## 2023-03-14 ENCOUNTER — Encounter (HOSPITAL_COMMUNITY): Payer: Self-pay | Admitting: Obstetrics and Gynecology

## 2023-03-14 ENCOUNTER — Inpatient Hospital Stay (HOSPITAL_COMMUNITY): Payer: 59

## 2023-03-14 ENCOUNTER — Inpatient Hospital Stay (HOSPITAL_COMMUNITY)
Admission: AD | Admit: 2023-03-14 | Discharge: 2023-03-14 | Disposition: A | Discharge status: Home / Self Care | Payer: 59 | Attending: Obstetrics and Gynecology | Admitting: Obstetrics and Gynecology

## 2023-03-14 DIAGNOSIS — Z3201 Encounter for pregnancy test, result positive: Secondary | ICD-10-CM | POA: Diagnosis not present

## 2023-03-14 DIAGNOSIS — R1032 Left lower quadrant pain: Secondary | ICD-10-CM | POA: Diagnosis not present

## 2023-03-14 DIAGNOSIS — Z3A01 Less than 8 weeks gestation of pregnancy: Secondary | ICD-10-CM | POA: Diagnosis not present

## 2023-03-14 DIAGNOSIS — R109 Unspecified abdominal pain: Secondary | ICD-10-CM | POA: Diagnosis not present

## 2023-03-14 DIAGNOSIS — O26891 Other specified pregnancy related conditions, first trimester: Secondary | ICD-10-CM | POA: Diagnosis not present

## 2023-03-14 LAB — COMPREHENSIVE METABOLIC PANEL
ALT: 11 U/L (ref 0–44)
AST: 16 U/L (ref 15–41)
Albumin: 3.7 g/dL (ref 3.5–5.0)
Alkaline Phosphatase: 60 U/L (ref 38–126)
Anion gap: 9 (ref 5–15)
BUN: 9 mg/dL (ref 6–20)
CO2: 24 mmol/L (ref 22–32)
Calcium: 9.2 mg/dL (ref 8.9–10.3)
Chloride: 104 mmol/L (ref 98–111)
Creatinine, Ser: 0.71 mg/dL (ref 0.44–1.00)
GFR, Estimated: >60 mL/min (ref >60)
Glucose, Bld: 92 mg/dL (ref 70–99)
Potassium: 4.2 mmol/L (ref 3.5–5.1)
Sodium: 137 mmol/L (ref 135–145)
Total Bilirubin: 0.4 mg/dL (ref 0.0–1.2)
Total Protein: 7.2 g/dL (ref 6.5–8.1)

## 2023-03-14 LAB — URINALYSIS, ROUTINE W REFLEX MICROSCOPIC
Bilirubin Urine: NEGATIVE
Glucose, UA: NEGATIVE mg/dL
Hgb urine dipstick: NEGATIVE
Ketones, ur: NEGATIVE mg/dL
Nitrite: NEGATIVE
Protein, ur: NEGATIVE mg/dL
Specific Gravity, Urine: 1.027 (ref 1.005–1.030)
pH: 5 (ref 5.0–8.0)

## 2023-03-14 LAB — CBC
HCT: 37.9 % (ref 36.0–46.0)
Hemoglobin: 12.4 g/dL (ref 12.0–15.0)
MCH: 25.8 pg — ABNORMAL LOW (ref 26.0–34.0)
MCHC: 32.7 g/dL (ref 30.0–36.0)
MCV: 78.8 fL — ABNORMAL LOW (ref 80.0–100.0)
Platelets: 341 10*3/uL (ref 150–400)
RBC: 4.81 MIL/uL (ref 3.87–5.11)
RDW: 15.2 % (ref 11.5–15.5)
WBC: 5.7 10*3/uL (ref 4.0–10.5)
nRBC: 0 % (ref 0.0–0.2)

## 2023-03-14 LAB — HCG, QUANTITATIVE, PREGNANCY: hCG, Beta Chain, Quant, S: 12356 m[IU]/mL — ABNORMAL HIGH (ref <5)

## 2023-03-14 LAB — WET PREP, GENITAL
Clue Cells Wet Prep HPF POC: NONE SEEN
Sperm: NONE SEEN
Trich, Wet Prep: NONE SEEN
WBC, Wet Prep HPF POC: <10 (ref <10)
Yeast Wet Prep HPF POC: NONE SEEN

## 2023-03-14 LAB — POCT PREGNANCY, URINE: Preg Test, Ur: POSITIVE — AB

## 2023-03-14 MED ORDER — ACETAMINOPHEN 325 MG PO TABS: 650.0000 mg | ORAL_TABLET | Freq: Once | ORAL | Status: DC

## 2023-03-14 NOTE — MAU Note (Signed)
 Pt says she went to Urgent Care - at 2 pm - she was in denial from HPT - they got positive UPT. Has abd pain 1-2 weeks ago  8/10- did not take anything for pain  LMP 02-05-2023 No VB

## 2023-03-14 NOTE — MAU Provider Note (Signed)
 History     CSN: [id number]  Arrival date and time: 03/14/23 2007   Event Date/Time   First Provider Initiated Contact with Patient 03/14/23 2116      Chief Complaint  Patient presents with   Abdominal Pain   Susan Hart is a 38 y.o. G3P2002 at [redacted]w[redacted]d by LMP of Jan 15 who receives care at [facility]; [facility].  She presents today for abdominal pain.  Patient states pain started about on week ago.  She describes the pain as cramping and stabbing that is worse at night. She reports that she had a positive UPT yesterday. No issues with urination, constipation, or diarrhea. She rates the pain a 8/10.    OB History     Gravida  3   Para  2   Term  2   Preterm      AB      Living  2      SAB      IAB      Ectopic      Multiple  0   Live Births  2           Past Medical History:  Diagnosis Date   Anxiety    just since being pregnant, 2015   SVD (spontaneous vaginal delivery) 05/12/2014    Past Surgical History:  Procedure Laterality Date   NO PAST SURGERIES      Family History  Adopted: Yes  Problem Relation Age of Onset   Alcohol abuse Neg Hx    Arthritis Neg Hx    Asthma Neg Hx    Birth defects Neg Hx    Cancer Neg Hx    COPD Neg Hx    Depression Neg Hx    Diabetes Neg Hx    Early death Neg Hx    Drug abuse Neg Hx    Hearing loss Neg Hx    Heart disease Neg Hx    Hyperlipidemia Neg Hx    Hypertension Neg Hx    Kidney disease Neg Hx    Learning disabilities Neg Hx    Mental illness Neg Hx    Mental retardation Neg Hx    Miscarriages / Stillbirths Neg Hx    Stroke Neg Hx    Vision loss Neg Hx    Varicose Veins Neg Hx     Social History   Tobacco Use   Smoking status: Never   Smokeless tobacco: Never  Substance Use Topics   Alcohol use: No   Drug use: No    Allergies:  Allergies  Allergen Reactions   Fluconazole Itching    Medications Prior to Admission  Medication Sig Dispense Refill Last Dose/Taking    flintstones complete (FLINTSTONES) 60 MG chewable tablet Chew 2 tablets by mouth every evening.      hydrOXYzine (ATARAX) 25 MG tablet Take 1 tablet (25 mg total) by mouth every 6 (six) hours. 12 tablet 0    IRON PO Take 1 tablet by mouth every evening.      metroNIDAZOLE (FLAGYL) 500 MG tablet Take 1 tablet (500 mg total) by mouth 2 (two) times daily. 14 tablet 0    naproxen (NAPROSYN) 375 MG tablet Take 1 tablet twice daily as needed for chest wall pain. 20 tablet 0     Review of Systems  Gastrointestinal:  Positive for nausea. Negative for constipation, diarrhea and vomiting.  Genitourinary:  Negative for difficulty urinating, dysuria, vaginal bleeding and vaginal discharge.  Neurological:  Negative  for headaches.   Physical Exam   Blood pressure (!) 124/54, pulse 76, temperature 98.2 F (36.8 C), temperature source Oral, resp. rate 16, height 5\' 11"  (1.803 m), weight 99 kg, last menstrual period 02/05/2023, unknown if currently breastfeeding.  Physical Exam Vitals reviewed.  Constitutional:      Appearance: She is well-developed.  HENT:     Head: Normocephalic and atraumatic.  Eyes:     Conjunctiva/sclera: Conjunctivae normal.  Cardiovascular:     Rate and Rhythm: Normal rate.  Pulmonary:     Effort: Pulmonary effort is normal. No respiratory distress.  Musculoskeletal:        General: Normal range of motion.  Skin:    General: Skin is warm and dry.  Neurological:     Mental Status: She is alert and oriented to person, place, and time.  Psychiatric:        Mood and Affect: Mood normal.        Behavior: Behavior normal.     MAU Course  Procedures Results for orders placed or performed during the hospital encounter of 03/14/23 (from the past 24 hours)  CBC     Status: Abnormal   Collection Time: 03/14/23  8:51 PM  Result Value Ref Range   WBC 5.7 4.0 - 10.5 K/uL   RBC 4.81 3.87 - 5.11 MIL/uL   Hemoglobin 12.4 12.0 - 15.0 g/dL   HCT 29.5 28.4 - 13.2 %   MCV 78.8  (L) 80.0 - 100.0 fL   MCH 25.8 (L) 26.0 - 34.0 pg   MCHC 32.7 30.0 - 36.0 g/dL   RDW 44.0 10.2 - 72.5 %   Platelets 341 150 - 400 K/uL   nRBC 0.0 0.0 - 0.2 %  hCG, quantitative, pregnancy     Status: Abnormal   Collection Time: 03/14/23  8:51 PM  Result Value Ref Range   hCG, Beta Chain, Quant, S 12,356 (H) <5 mIU/mL  Comprehensive metabolic panel     Status: None   Collection Time: 03/14/23  8:51 PM  Result Value Ref Range   Sodium 137 135 - 145 mmol/L   Potassium 4.2 3.5 - 5.1 mmol/L   Chloride 104 98 - 111 mmol/L   CO2 24 22 - 32 mmol/L   Glucose, Bld 92 70 - 99 mg/dL   BUN 9 6 - 20 mg/dL   Creatinine, Ser 3.66 0.44 - 1.00 mg/dL   Calcium 9.2 8.9 - 44.0 mg/dL   Total Protein 7.2 6.5 - 8.1 g/dL   Albumin 3.7 3.5 - 5.0 g/dL   AST 16 15 - 41 U/L   ALT 11 0 - 44 U/L   Alkaline Phosphatase 60 38 - 126 U/L   Total Bilirubin 0.4 0.0 - 1.2 mg/dL   GFR, Estimated >34 >74 mL/min   Anion gap 9 5 - 15  Urinalysis, Routine w reflex microscopic -Urine, Clean Catch     Status: Abnormal   Collection Time: 03/14/23  9:00 PM  Result Value Ref Range   Color, Urine YELLOW YELLOW   APPearance HAZY (A) CLEAR   Specific Gravity, Urine 1.027 1.005 - 1.030   pH 5.0 5.0 - 8.0   Glucose, UA NEGATIVE NEGATIVE mg/dL   Hgb urine dipstick NEGATIVE NEGATIVE   Bilirubin Urine NEGATIVE NEGATIVE   Ketones, ur NEGATIVE NEGATIVE mg/dL   Protein, ur NEGATIVE NEGATIVE mg/dL   Nitrite NEGATIVE NEGATIVE   Leukocytes,Ua MODERATE (A) NEGATIVE   RBC / HPF 0-5 0 - 5 RBC/hpf   WBC,  UA 0-5 0 - 5 WBC/hpf   Bacteria, UA RARE (A) NONE SEEN   Squamous Epithelial / HPF 6-10 0 - 5 /HPF   Mucus PRESENT   Wet prep, genital     Status: None   Collection Time: 03/14/23  9:02 PM  Result Value Ref Range   Yeast Wet Prep HPF POC NONE SEEN NONE SEEN   Trich, Wet Prep NONE SEEN NONE SEEN   Clue Cells Wet Prep HPF POC NONE SEEN NONE SEEN   WBC, Wet Prep HPF POC <10 <10   Sperm NONE SEEN   Pregnancy, urine POC      Status: Abnormal   Collection Time: 03/14/23  9:07 PM  Result Value Ref Range   Preg Test, Ur POSITIVE (A) NEGATIVE   US OB LESS THAN 14 WEEKS WITH OB TRANSVAGINAL Result Date: 03/14/2023 CLINICAL DATA:  Abdominal pain EXAM: OBSTETRIC <14 WK [place] AND TRANSVAGINAL OB US TECHNIQUE: Both transabdominal and transvaginal ultrasound examinations were performed for complete evaluation of the gestation as well as the maternal uterus, adnexal regions, and pelvic cul-de-sac. Transvaginal technique was performed to assess early pregnancy. COMPARISON:  None Available. FINDINGS: Intrauterine gestational sac: Single Yolk sac:  Visualized. Embryo:  Not Visualized. Cardiac Activity: Not Visualized. Heart Rate:   bpm MSD: 8.4 mm   5 w   4 d CRL:    mm    w    d                  [place] EDC: Subchorionic hemorrhage:  None visualized. Maternal uterus/adnexae: No adnexal mass or free fluid. IMPRESSION: Five week 4 day intrauterine gestational sac. Yolk sac is visualized, but no fetal pole currently. This could be followed with repeat ultrasound in 10-14 days to ensure expected progression. Electronically Signed   By: Charlett Nose M.D.   On: 03/14/2023 22:27     MDM Physical Exam Cultures: Wet Prep and GC/CT Labs: UA, UPT, CBC, CMP, hCG, Ultrasound Assessment and Plan  38 year old  G3P2002 at 5.2 weeks Abdominal Cramping  -POC Reviewed. -Labs ordered -Cultures ordered. -Patient offered and accepts pain medication.  -Will give tylenol. -Send for [place] and await results.   Cherre Robins 03/14/2023, 9:16 PM   Reassessment (11:01 PM) -Results as above. -Patient informed of findings. -Discussed need for follow up US in 2 weeks. -Patient states she will schedule with primary ob. -Precautions reviewed. -Encouraged to call primary office or return to MAU if symptoms worsen or with the onset of new symptoms. -Discharged to home in stable condition.  Cherre Robins MSN, CNM Advanced Practice Provider, [facility]  [employer]

## 2023-03-17 DIAGNOSIS — Z3A01 Less than 8 weeks gestation of pregnancy: Secondary | ICD-10-CM | POA: Diagnosis not present

## 2023-03-17 DIAGNOSIS — O3680X0 Pregnancy with inconclusive fetal viability, not applicable or unspecified: Secondary | ICD-10-CM | POA: Diagnosis not present

## 2023-03-17 DIAGNOSIS — Z111 Encounter for screening for respiratory tuberculosis: Secondary | ICD-10-CM | POA: Diagnosis not present

## 2023-03-17 LAB — GC/CHLAMYDIA PROBE AMP (~~LOC~~) NOT AT ARMC
Chlamydia: NEGATIVE
Comment: NEGATIVE
Comment: NORMAL
Neisseria Gonorrhea: NEGATIVE

## 2023-03-27 DIAGNOSIS — O3680X0 Pregnancy with inconclusive fetal viability, not applicable or unspecified: Secondary | ICD-10-CM | POA: Diagnosis not present

## 2023-03-27 DIAGNOSIS — Z369 Encounter for antenatal screening, unspecified: Secondary | ICD-10-CM | POA: Diagnosis not present

## 2023-03-27 DIAGNOSIS — O09521 Supervision of elderly multigravida, first trimester: Secondary | ICD-10-CM | POA: Diagnosis not present

## 2023-03-27 DIAGNOSIS — Z3A01 Less than 8 weeks gestation of pregnancy: Secondary | ICD-10-CM | POA: Diagnosis not present

## 2023-04-01 DIAGNOSIS — E6609 Other obesity due to excess calories: Secondary | ICD-10-CM | POA: Diagnosis not present

## 2023-04-01 DIAGNOSIS — Z0000 Encounter for general adult medical examination without abnormal findings: Secondary | ICD-10-CM | POA: Diagnosis not present

## 2023-04-01 DIAGNOSIS — E538 Deficiency of other specified B group vitamins: Secondary | ICD-10-CM | POA: Diagnosis not present

## 2023-04-01 DIAGNOSIS — F4321 Adjustment disorder with depressed mood: Secondary | ICD-10-CM | POA: Diagnosis not present

## 2023-04-01 DIAGNOSIS — Z6830 Body mass index (BMI) 30.0-30.9, adult: Secondary | ICD-10-CM | POA: Diagnosis not present

## 2023-04-01 DIAGNOSIS — E559 Vitamin D deficiency, unspecified: Secondary | ICD-10-CM | POA: Diagnosis not present

## 2023-04-01 DIAGNOSIS — R109 Unspecified abdominal pain: Secondary | ICD-10-CM | POA: Diagnosis not present

## 2023-04-01 DIAGNOSIS — E66811 Obesity, class 1: Secondary | ICD-10-CM | POA: Diagnosis not present

## 2023-04-10 DIAGNOSIS — O3680X0 Pregnancy with inconclusive fetal viability, not applicable or unspecified: Secondary | ICD-10-CM | POA: Diagnosis not present

## 2023-04-10 DIAGNOSIS — Z3A09 9 weeks gestation of pregnancy: Secondary | ICD-10-CM | POA: Diagnosis not present

## 2023-04-10 DIAGNOSIS — O021 Missed abortion: Secondary | ICD-10-CM | POA: Diagnosis not present

## 2023-04-14 DIAGNOSIS — O034 Incomplete spontaneous abortion without complication: Secondary | ICD-10-CM | POA: Diagnosis not present

## 2023-04-14 DIAGNOSIS — Z883 Allergy status to other anti-infective agents status: Secondary | ICD-10-CM | POA: Diagnosis not present

## 2023-04-14 DIAGNOSIS — F419 Anxiety disorder, unspecified: Secondary | ICD-10-CM | POA: Diagnosis not present

## 2023-04-29 DIAGNOSIS — Z3009 Encounter for other general counseling and advice on contraception: Secondary | ICD-10-CM | POA: Diagnosis not present

## 2023-04-29 DIAGNOSIS — Z9889 Other specified postprocedural states: Secondary | ICD-10-CM | POA: Diagnosis not present

## 2023-09-22 DIAGNOSIS — R079 Chest pain, unspecified: Secondary | ICD-10-CM | POA: Diagnosis not present

## 2023-09-30 DIAGNOSIS — R072 Precordial pain: Secondary | ICD-10-CM | POA: Diagnosis not present

## 2023-10-04 ENCOUNTER — Encounter (HOSPITAL_COMMUNITY): Payer: Self-pay | Admitting: *Deleted

## 2023-10-04 ENCOUNTER — Other Ambulatory Visit: Payer: Self-pay

## 2023-10-04 ENCOUNTER — Emergency Department (HOSPITAL_COMMUNITY)

## 2023-10-04 ENCOUNTER — Emergency Department (HOSPITAL_COMMUNITY)
Admission: EM | Admit: 2023-10-04 | Discharge: 2023-10-04 | Disposition: A | Discharge status: Home / Self Care | Attending: Emergency Medicine | Admitting: Emergency Medicine

## 2023-10-04 ENCOUNTER — Emergency Department (HOSPITAL_BASED_OUTPATIENT_CLINIC_OR_DEPARTMENT_OTHER): Admitting: Radiology

## 2023-10-04 ENCOUNTER — Emergency Department (HOSPITAL_BASED_OUTPATIENT_CLINIC_OR_DEPARTMENT_OTHER)
Admission: EM | Admit: 2023-10-04 | Discharge: 2023-10-05 | Disposition: A | Discharge status: Home / Self Care | Source: Home / Self Care | Attending: Emergency Medicine | Admitting: Emergency Medicine

## 2023-10-04 ENCOUNTER — Emergency Department (HOSPITAL_BASED_OUTPATIENT_CLINIC_OR_DEPARTMENT_OTHER)

## 2023-10-04 ENCOUNTER — Encounter (HOSPITAL_BASED_OUTPATIENT_CLINIC_OR_DEPARTMENT_OTHER): Payer: Self-pay | Admitting: Emergency Medicine

## 2023-10-04 DIAGNOSIS — R079 Chest pain, unspecified: Secondary | ICD-10-CM | POA: Insufficient documentation

## 2023-10-04 DIAGNOSIS — R7989 Other specified abnormal findings of blood chemistry: Secondary | ICD-10-CM | POA: Diagnosis not present

## 2023-10-04 DIAGNOSIS — Z743 Need for continuous supervision: Secondary | ICD-10-CM | POA: Diagnosis not present

## 2023-10-04 DIAGNOSIS — M542 Cervicalgia: Secondary | ICD-10-CM | POA: Diagnosis not present

## 2023-10-04 DIAGNOSIS — R0789 Other chest pain: Secondary | ICD-10-CM | POA: Diagnosis not present

## 2023-10-04 DIAGNOSIS — R0602 Shortness of breath: Secondary | ICD-10-CM | POA: Diagnosis not present

## 2023-10-04 DIAGNOSIS — R072 Precordial pain: Secondary | ICD-10-CM | POA: Diagnosis present

## 2023-10-04 DIAGNOSIS — R110 Nausea: Secondary | ICD-10-CM | POA: Insufficient documentation

## 2023-10-04 DIAGNOSIS — M546 Pain in thoracic spine: Secondary | ICD-10-CM | POA: Diagnosis not present

## 2023-10-04 LAB — BASIC METABOLIC PANEL WITH GFR
Anion gap: 9 (ref 5–15)
Anion gap: 12 (ref 5–15)
BUN: 6 mg/dL (ref 6–20)
BUN: 10 mg/dL (ref 6–20)
CO2: 25 mmol/L (ref 22–32)
CO2: 24 mmol/L (ref 22–32)
Calcium: 9.3 mg/dL (ref 8.9–10.3)
Calcium: 9.6 mg/dL (ref 8.9–10.3)
Chloride: 102 mmol/L (ref 98–111)
Chloride: 104 mmol/L (ref 98–111)
Creatinine, Ser: 0.83 mg/dL (ref 0.44–1.00)
Creatinine, Ser: 0.9 mg/dL (ref 0.44–1.00)
GFR, Estimated: >60 mL/min (ref >60)
GFR, Estimated: >60 mL/min (ref >60)
Glucose, Bld: 85 mg/dL (ref 70–99)
Glucose, Bld: 83 mg/dL (ref 70–99)
Potassium: 4 mmol/L (ref 3.5–5.1)
Potassium: 3.9 mmol/L (ref 3.5–5.1)
Sodium: 136 mmol/L (ref 135–145)
Sodium: 140 mmol/L (ref 135–145)

## 2023-10-04 LAB — CBC
HCT: 39.2 % (ref 36.0–46.0)
HCT: 39.6 % (ref 36.0–46.0)
Hemoglobin: 12.8 g/dL (ref 12.0–15.0)
Hemoglobin: 12.7 g/dL (ref 12.0–15.0)
MCH: 25.2 pg — ABNORMAL LOW (ref 26.0–34.0)
MCH: 25 pg — ABNORMAL LOW (ref 26.0–34.0)
MCHC: 32.7 g/dL (ref 30.0–36.0)
MCHC: 32.1 g/dL (ref 30.0–36.0)
MCV: 77.2 fL — ABNORMAL LOW (ref 80.0–100.0)
MCV: 78.1 fL — ABNORMAL LOW (ref 80.0–100.0)
Platelets: 355 K/uL (ref 150–400)
Platelets: 361 K/uL (ref 150–400)
RBC: 5.08 MIL/uL (ref 3.87–5.11)
RBC: 5.07 MIL/uL (ref 3.87–5.11)
RDW: 15.1 % (ref 11.5–15.5)
RDW: 15.2 % (ref 11.5–15.5)
WBC: 4.9 K/uL (ref 4.0–10.5)
WBC: 5.5 K/uL (ref 4.0–10.5)
nRBC: 0 % (ref 0.0–0.2)
nRBC: 0 % (ref 0.0–0.2)

## 2023-10-04 LAB — HEPATIC FUNCTION PANEL
ALT: 13 U/L (ref 0–44)
AST: 17 U/L (ref 15–41)
Albumin: 4.1 g/dL (ref 3.5–5.0)
Alkaline Phosphatase: 76 U/L (ref 38–126)
Bilirubin, Direct: 0.1 mg/dL (ref 0.0–0.2)
Indirect Bilirubin: 0.2 mg/dL — ABNORMAL LOW (ref 0.3–0.9)
Total Bilirubin: 0.3 mg/dL (ref 0.0–1.2)
Total Protein: 7.8 g/dL (ref 6.5–8.1)

## 2023-10-04 LAB — LIPASE, BLOOD: Lipase: 27 U/L (ref 11–51)

## 2023-10-04 LAB — TROPONIN T, HIGH SENSITIVITY: Troponin T High Sensitivity: <15 ng/L (ref 0–19)

## 2023-10-04 LAB — TROPONIN I (HIGH SENSITIVITY)
Troponin I (High Sensitivity): 6 ng/L (ref <18)
Troponin I (High Sensitivity): 6 ng/L (ref <18)

## 2023-10-04 LAB — D-DIMER, QUANTITATIVE: D-Dimer, Quant: 0.57 ug{FEU}/mL — ABNORMAL HIGH (ref 0.00–0.50)

## 2023-10-04 LAB — HCG, SERUM, QUALITATIVE: Preg, Serum: NEGATIVE

## 2023-10-04 MED ORDER — KETOROLAC TROMETHAMINE 30 MG/ML IJ SOLN
30.0000 mg | Freq: Once | INTRAMUSCULAR | Status: AC
  Administered 2023-10-04: 30 mg via INTRAVENOUS
  Filled 2023-10-04: qty 1

## 2023-10-04 MED ORDER — ASPIRIN 81 MG PO CHEW
324.0000 mg | CHEWABLE_TABLET | Freq: Once | ORAL | Status: AC
  Administered 2023-10-04: 324 mg via ORAL
  Filled 2023-10-04: qty 4

## 2023-10-04 MED ORDER — LIDOCAINE VISCOUS HCL 2 % MT SOLN
15.0000 mL | Freq: Once | OROMUCOSAL | Status: AC
  Administered 2023-10-04: 15 mL via OROMUCOSAL
  Filled 2023-10-04: qty 15

## 2023-10-04 MED ORDER — IOHEXOL 350 MG/ML SOLN
75.0000 mL | Freq: Once | INTRAVENOUS | Status: AC | PRN
  Administered 2023-10-04: 75 mL via INTRAVENOUS

## 2023-10-04 MED ORDER — PANTOPRAZOLE SODIUM 40 MG PO TBEC: 40.0000 mg | DELAYED_RELEASE_TABLET | Freq: Every day | ORAL | 0 refills | Status: AC

## 2023-10-04 MED ORDER — ALUM & MAG HYDROXIDE-SIMETH 200-200-20 MG/5ML PO SUSP
30.0000 mL | Freq: Once | ORAL | Status: AC
  Administered 2023-10-04: 30 mL via ORAL
  Filled 2023-10-04: qty 30

## 2023-10-04 NOTE — ED Provider Notes (Signed)
 [name] Provider Note   CSN: [id number] Arrival date & time: 10/04/23  2020     Patient presents with: Chest Pain   Susan Hart is a 38 y.o. female.  {Add pertinent medical, surgical, social history, OB history to YEP:67052} Patient returns with central chest pain and pressure that has been ongoing for the past 3 weeks that is constant.  Central chest pressure that radiates to her mid back.  This is constant but she has intermittent sharp pain at times that last for several minutes to hours at a time without radiation.  No associated shortness of breath, nausea, vomiting, diaphoresis.  She was seen at [facility] earlier today and had a reassuring workup with normal heart enzymes as well as CT angiogram was negative for PE.  She is scheduled for a CT coronary study later this week.  Comes in today because she has worsening chest pain and pressure that radiates to her mid back.  No vomiting but does feel nauseous.  No associate shortness of breath, cough or fever.  No abdominal pain, pain with urination or blood in the urine.  Has a history of anxiety hydroxyzine .  Denies any cardiac history. No leg pain or leg swelling.  No fever.  No cough runny nose or sore throat.  No history of ulcers or acid reflux.  The history is provided by the patient.  Chest Pain Associated symptoms: no abdominal pain, no dizziness, no fever, no headache, no nausea, no shortness of breath, no vomiting and no weakness        Prior to Admission medications   Medication Sig Start Date End Date Taking? Authorizing Provider  flintstones complete (FLINTSTONES) 60 MG chewable tablet Chew 2 tablets by mouth every evening.    [provider]  IRON PO Take 1 tablet by mouth every evening.    [provider]  pantoprazole  (PROTONIX ) 40 MG tablet Take 1 tablet (40 mg total) by mouth daily. 10/04/23   Towana Ozell BROCKS, MD    Allergies: Fluconazole     Review of Systems  Constitutional:  Negative for activity change, appetite change and fever.  HENT:  Negative for congestion and rhinorrhea.   Respiratory:  Positive for chest tightness. Negative for shortness of breath.   Cardiovascular:  Positive for chest pain.  Gastrointestinal:  Negative for abdominal pain, nausea and vomiting.  Genitourinary:  Negative for dysuria.  Musculoskeletal:  Negative for arthralgias and myalgias.  Skin:  Negative for rash.  Neurological:  Negative for dizziness, weakness and headaches.    all other systems are negative except as noted in the HPI and PMH.   Updated Vital Signs BP 136/64 (BP Location: Right Arm)   Pulse 72   Temp 99.2 F (37.3 C)   Resp (!) 22   LMP 02/05/2023 Comment: Pt states she takes birth control. Pt states she had to terminate her pregnancy in March of 2025.  SpO2 100%   Physical Exam Vitals and nursing note reviewed.  Constitutional:      General: She is not in acute distress.    Appearance: She is well-developed.  HENT:     Head: Normocephalic and atraumatic.     Mouth/Throat:     Pharynx: No oropharyngeal exudate.  Eyes:     Conjunctiva/sclera: Conjunctivae normal.     Pupils: Pupils are equal, round, and reactive to light.  Neck:     Comments: No meningismus. Cardiovascular:     Rate and  Rhythm: Normal rate and regular rhythm.     Heart sounds: Normal heart sounds. No murmur heard. Pulmonary:     Effort: Pulmonary effort is normal. No respiratory distress.     Breath sounds: Normal breath sounds.  Abdominal:     Palpations: Abdomen is soft.     Tenderness: There is no abdominal tenderness. There is no guarding or rebound.  Musculoskeletal:        General: No tenderness. Normal range of motion.     Cervical back: Normal range of motion and neck supple.  Skin:    General: Skin is warm.  Neurological:     Mental Status: She is alert and oriented to person, place, and time.     Cranial Nerves: No cranial  nerve deficit.     Motor: No abnormal muscle tone.     Coordination: Coordination normal.     Comments:  5/5 strength throughout. CN 2-12 intact.Equal grip strength.   Psychiatric:        Behavior: Behavior normal.     (all labs ordered are listed, but only abnormal results are displayed) Labs Reviewed  CBC - Abnormal; Notable for the following components:      Result Value   MCV 78.1 (*)    MCH 25.0 (*)    All other components within normal limits  BASIC METABOLIC PANEL WITH GFR  PREGNANCY, URINE  HEPATIC FUNCTION PANEL  LIPASE, BLOOD  TROPONIN T, HIGH SENSITIVITY  TROPONIN T, HIGH SENSITIVITY    EKG: EKG Interpretation Date/Time:  Saturday October 04 2023 20:29:58 EDT Ventricular Rate:  68 PR Interval:  176 QRS Duration:  84 QT Interval:  390 QTC Calculation: 414 R Axis:   78  Text Interpretation: Normal sinus rhythm Possible Anterior infarct , age undetermined Abnormal ECG When compared with ECG of 04-Oct-2023 08:33, PREVIOUS ECG IS PRESENT Confirmed by Yolande Charleston [phone]) on 10/04/2023 10:53:57 PM  Radiology: [facility] Chest 2 View Result Date: 10/04/2023 CLINICAL DATA:  Midsternal epigastric pain radiating to neck EXAM: CHEST - 2 VIEW COMPARISON:  10/04/2023 FINDINGS: The heart size and mediastinal contours are within normal limits. Both lungs are clear. The visualized skeletal structures are unremarkable. IMPRESSION: No active cardiopulmonary disease. Electronically Signed   By: Ozell Daring M.D.   On: 10/04/2023 22:38   CT Angio Chest PE W/Cm &/Or Wo Cm Result Date: 10/04/2023 EXAM: CTA of the Chest with contrast for PE 10/04/2023 11:29:22 AM TECHNIQUE: CTA of the chest was performed after the administration of intravenous contrast. Multiplanar reformatted images are provided for review. MIP images are provided for review. Automated exposure control, iterative reconstruction, and/or weight based adjustment of the mA/kV was utilized to reduce the radiation dose to as  low as reasonably achievable. COMPARISON: 1 view chest x-ray 10/04/2023 CLINICAL HISTORY: Pulmonary embolism (PE) suspected, low to intermediate prob, positive D-dimer. Chief complaints; Chest Pain; CT Angio Chest PE W/Cm \\T \/Or Wo Cm; Pulmonary embolism (PE) suspected, low to intermediate prob, positive D-dimer; 75ml Omni350 FINDINGS: PULMONARY ARTERIES: Pulmonary arteries are adequately opacified for evaluation. No pulmonary embolism. Main pulmonary artery is normal in caliber. MEDIASTINUM: The heart and pericardium demonstrate no acute abnormality. There is no acute abnormality of the thoracic aorta. LYMPH NODES: No mediastinal, hilar or axillary lymphadenopathy. LUNGS AND PLEURA: The lungs are without acute process. No focal consolidation or pulmonary edema. No pleural effusion or pneumothorax. UPPER ABDOMEN: Limited images of the upper abdomen are unremarkable. SOFT TISSUES AND BONES: No acute bone or soft tissue abnormality. IMPRESSION:  1. No pulmonary embolism or other acute abnormality. Electronically signed by: Lonni Necessary MD 10/04/2023 12:06 PM EDT RP Workstation: [id number]   DG Chest Port 1 View Result Date: 10/04/2023 EXAM: 1 VIEW XRAY OF THE CHEST 10/04/2023 09:31:00 AM COMPARISON: 08/25/2021 CLINICAL HISTORY: cp. Per ED triage notes: Patient presents to ed via [facility] from home c/o sharp chest pain onset 2 am, states she has had chest pain for several weeks was seen at [facility] had trop. And chest xray was referred to cards of which she saw this week at [facility] , and ; is schedule for CT this coming wed. Denies cough , states pain is a sharp stabbing pain. Worse with movement. FINDINGS: LUNGS AND PLEURA: No focal pulmonary opacity. No pulmonary edema. No pleural effusion. No pneumothorax. HEART AND MEDIASTINUM: No acute abnormality of the cardiac and mediastinal silhouettes. BONES AND SOFT TISSUES: No acute osseous abnormality. IMPRESSION: 1. No acute process. Electronically signed by: Waddell Calk MD 10/04/2023 09:56 AM EDT RP Workstation: [id number]    {Document cardiac monitor, telemetry assessment procedure when appropriate:32947} Procedures   Medications Ordered in the ED  alum & mag hydroxide-simeth (MAALOX/MYLANTA) 200-200-20 MG/5ML suspension 30 mL (has no administration in time range)  lidocaine  (XYLOCAINE ) 2 % viscous mouth solution 15 mL (has no administration in time range)      {Click here for ABCD2, HEART and other calculators REFRESH Note before signing:1}                              Medical Decision Making Amount and/or Complexity of Data Reviewed Labs: ordered. Decision-making details documented in ED Course. Radiology: ordered and independent interpretation performed. Decision-making details documented in ED Course. ECG/medicine tests: ordered and independent interpretation performed. Decision-making details documented in ED Course.  Risk OTC drugs. Prescription drug management.   3 Weeks of central chest pain radiates through mid back with associated shortness of breath nausea.  EKG without acute ischemic findings.  CT angiogram performed earlier today reviewed and is negative for PE.  EKG with T wave inversion lead III.  Unchanged from previous.  {Document critical care time when appropriate  Document review of labs and clinical decision tools ie CHADS2VASC2, etc  Document your independent review of radiology images and any outside records  Document your discussion with family members, caretakers and with consultants  Document social determinants of health affecting pt's care  Document your decision making why or why not admission, treatments were needed:32947:::1}   Final diagnoses:  None    ED Discharge Orders     None

## 2023-10-04 NOTE — ED Provider Notes (Signed)
 [place] EMERGENCY DEPARTMENT AT [facility] Provider Note   CSN: [id number] Arrival date & time: 10/04/23  0830     Patient presents with: Chest Pain   Susan Hart is a 38 y.o. female.  She is here with a complaint of sharp stabbing central chest pain radiating through to her back that is been going on and off for 3 weeks.  It woke her up at 2 AM and was severe associated with feeling weak and numb on the left side of her body.  She ultimately went back to bed but was still there at 7 AM when she woke up again.  She took an ambulance here for further evaluation.  She has been seen for this pain at urgent care and followed up with cardiology.  She has a pending cardiac CT this week.  She is a non-smoker, denies cocaine.  No history of diabetes or hypertension.  Unknown family history adopted.  She has tried nothing for her symptoms.   The history is provided by the patient.  Chest Pain Pain location:  Substernal area Pain quality: sharp and stabbing   Pain radiates to:  Upper back Pain severity:  Severe Onset quality:  Sudden Duration:  7 hours Timing:  Constant Progression:  Unchanged Chronicity:  Recurrent Context: at rest   Relieved by:  None tried Worsened by:  Movement Ineffective treatments:  None tried Associated symptoms: no abdominal pain, no cough, no diaphoresis, no dizziness, no fever, no nausea, no palpitations, no shortness of breath and no vomiting        Prior to Admission medications   Medication Sig Start Date End Date Taking? Authorizing Provider  flintstones complete (FLINTSTONES) 60 MG chewable tablet Chew 2 tablets by mouth every evening.    [provider]  IRON PO Take 1 tablet by mouth every evening.    [provider]    Allergies: Fluconazole    Review of Systems  Constitutional:  Negative for diaphoresis and fever.  Respiratory:  Negative for cough and shortness of breath.   Cardiovascular:  Positive for chest  pain. Negative for palpitations.  Gastrointestinal:  Negative for abdominal pain, nausea and vomiting.  Neurological:  Negative for dizziness.    Updated Vital Signs BP 133/71 (BP Location: Right Arm)   Pulse 62   Temp 97.9 F (36.6 C) (Oral)   Resp 18   Ht 5' 11 (1.803 m)   Wt 102.5 kg   LMP 02/05/2023   SpO2 100%   BMI 31.52 kg/m   Physical Exam Vitals and nursing note reviewed.  Constitutional:      General: She is not in acute distress.    Appearance: Normal appearance. She is well-developed.  HENT:     Head: Normocephalic and atraumatic.  Eyes:     Conjunctiva/sclera: Conjunctivae normal.  Cardiovascular:     Rate and Rhythm: Normal rate and regular rhythm.     Heart sounds: Normal heart sounds. No murmur heard. Pulmonary:     Effort: Pulmonary effort is normal. No respiratory distress.     Breath sounds: Normal breath sounds. No stridor. No wheezing.  Abdominal:     Palpations: Abdomen is soft.     Tenderness: There is no abdominal tenderness. There is no guarding or rebound.  Musculoskeletal:        General: No tenderness or deformity.     Cervical back: Neck supple.     Right lower leg: No tenderness. No edema.  Left lower leg: No tenderness. No edema.  Skin:    General: Skin is warm and dry.  Neurological:     General: No focal deficit present.     Mental Status: She is alert.     GCS: GCS eye subscore is 4. GCS verbal subscore is 5. GCS motor subscore is 6.     (all labs ordered are listed, but only abnormal results are displayed) Labs Reviewed  CBC - Abnormal; Notable for the following components:      Result Value   MCV 77.2 (*)    MCH 25.2 (*)    All other components within normal limits  D-DIMER, QUANTITATIVE - Abnormal; Notable for the following components:   D-Dimer, Quant 0.57 (*)    All other components within normal limits  BASIC METABOLIC PANEL WITH GFR  HCG, SERUM, QUALITATIVE  TROPONIN I (HIGH SENSITIVITY)  TROPONIN I (HIGH  SENSITIVITY)    EKG: EKG Interpretation Date/Time:  Saturday October 04 2023 08:33:19 EDT Ventricular Rate:  64 PR Interval:  192 QRS Duration:  87 QT Interval:  394 QTC Calculation: 407 R Axis:   35  Text Interpretation: Sinus rhythm LAE, consider biatrial enlargement Borderline low voltage, extremity leads No significant change since last tracing 9/24 Confirmed by Towana Sharper [phone]) on 10/04/2023 8:35:22 AM  Radiology: CT Angio Chest PE W/Cm &/Or Wo Cm Result Date: 10/04/2023 EXAM: CTA of the Chest with contrast for PE 10/04/2023 11:29:22 AM TECHNIQUE: CTA of the chest was performed after the administration of intravenous contrast. Multiplanar reformatted images are provided for review. MIP images are provided for review. Automated exposure control, iterative reconstruction, and/or weight based adjustment of the mA/kV was utilized to reduce the radiation dose to as low as reasonably achievable. COMPARISON: 1 view chest x-ray 10/04/2023 CLINICAL HISTORY: Pulmonary embolism (PE) suspected, low to intermediate prob, positive D-dimer. Chief complaints; Chest Pain; CT Angio Chest PE W/Cm \\T \/Or Wo Cm; Pulmonary embolism (PE) suspected, low to intermediate prob, positive D-dimer; 75ml Omni350 FINDINGS: PULMONARY ARTERIES: Pulmonary arteries are adequately opacified for evaluation. No pulmonary embolism. Main pulmonary artery is normal in caliber. MEDIASTINUM: The heart and pericardium demonstrate no acute abnormality. There is no acute abnormality of the thoracic aorta. LYMPH NODES: No mediastinal, hilar or axillary lymphadenopathy. LUNGS AND PLEURA: The lungs are without acute process. No focal consolidation or pulmonary edema. No pleural effusion or pneumothorax. UPPER ABDOMEN: Limited images of the upper abdomen are unremarkable. SOFT TISSUES AND BONES: No acute bone or soft tissue abnormality. IMPRESSION: 1. No pulmonary embolism or other acute abnormality. Electronically signed by:  Lonni Necessary MD 10/04/2023 12:06 PM EDT RP Workstation: [id number]   DG Chest Port 1 View Result Date: 10/04/2023 EXAM: 1 VIEW XRAY OF THE CHEST 10/04/2023 09:31:00 AM COMPARISON: 08/25/2021 CLINICAL HISTORY: cp. Per ED triage notes: Patient presents to ed via [facility] from home c/o sharp chest pain onset 2 am, states she has had chest pain for several weeks was seen at [facility] had trop. And chest xray was referred to cards of which she saw this week at [facility] , and ; is schedule for CT this coming wed. Denies cough , states pain is a sharp stabbing pain. Worse with movement. FINDINGS: LUNGS AND PLEURA: No focal pulmonary opacity. No pulmonary edema. No pleural effusion. No pneumothorax. HEART AND MEDIASTINUM: No acute abnormality of the cardiac and mediastinal silhouettes. BONES AND SOFT TISSUES: No acute osseous abnormality. IMPRESSION: 1. No acute process. Electronically signed by: Waddell Calk MD 10/04/2023 09:56 AM  EDT RP Workstation: [id number]     Procedures   Medications Ordered in the ED  aspirin  chewable tablet 324 mg (324 mg Oral Given 10/04/23 0951)  iohexol  (OMNIPAQUE ) 350 MG/ML injection 75 mL (75 mLs Intravenous Contrast Given 10/04/23 1130)  ketorolac  (TORADOL ) 30 MG/ML injection 30 mg (30 mg Intravenous Given 10/04/23 1217)    Clinical Course as of 10/04/23 1649  Sat Oct 04, 2023  0940 Chest x-ray interpreted by me as possible increased density right base.  Awaiting radiology reading. [MB]  1211 Patient's initial troponin unremarkable.  D-dimer mildly elevated.  Reviewed with her and have put her in for CT angio.  CT angios come back negative for acute PE.  Second troponin in process.  Will try some Toradol . [MB]  1251 We reviewed patient's workup with her and her family member.  She is comfortable plan for discharge and outpatient follow-up with her cardiology team.  She feels a little bit better after medications.  We discussed return instructions [MB]    Clinical Course User  Index [MB] Towana Ozell BROCKS, MD                                 Medical Decision Making Amount and/or Complexity of Data Reviewed Labs: ordered. Radiology: ordered.  Risk OTC drugs. Prescription drug management.   This patient complains of sharp stabbing chest pain; this involves an extensive number of treatment Options and is a complaint that carries with it a high risk of complications and morbidity. The differential includes ACS, pneumonia, pneumothorax, PE, vascular, musculoskeletal, anxiety  I ordered, reviewed and interpreted labs, which included CBC normal chemistries normal troponin flat D-dimer mildly elevated pregnancy negative I ordered medication aspirin  and Toradol  and reviewed PMP when indicated. I ordered imaging studies which included chest x-ray and CT angio chest and I independently    visualized and interpreted imaging which showed no acute findings Additional history obtained from patient's mother Previous records obtained and reviewed in epic including recent cardiology note Cardiac monitoring reviewed, normal sinus rhythm Social determinants considered, no significant barriers Critical Interventions: None  After the interventions stated above, I reevaluated the patient and found patient pain to be somewhat improved and her vital signs remained stable Admission and further testing considered, no indications for admission at this time.  She has outpatient follow-up with cardiology already arranged.  She is comfortable plan for discharge and outpatient follow-up.  Return instructions discussed      Final diagnoses:  Nonspecific chest pain    ED Discharge Orders     None          Towana Ozell BROCKS, MD 10/04/23 1650

## 2023-10-04 NOTE — ED Triage Notes (Signed)
 Stabbing and pressure mid sternal/epigastric pain radiates to the back Tearful in triage Started 3 weeks ago Seen at [facility] and by cardiology this week [facility]  for same

## 2023-10-04 NOTE — Discharge Instructions (Addendum)
 Please follow-up with your cardiology team as scheduled for further evaluation.  Return if any worsening or concerning symptoms

## 2023-10-04 NOTE — ED Notes (Signed)
 Patient transported to CT

## 2023-10-04 NOTE — ED Notes (Signed)
..  Patient is A&Ox4 upon discharge. Patient verbalized understanding of prescriptions, discharge instructions and follow-up care with cardiologist. Patient wheeled department with/by family

## 2023-10-04 NOTE — ED Triage Notes (Signed)
 Patient presents to ed via [facility] from home c/o sharp chest pain onset 2 am, states she has had chest pain for several weeks was seen at [facility] had trop. And chest xray was referred to cards of which she saw this week at [facility] , and is schedule for CT this coming wed. Denies cough , states pain is a sharp stabbing pain. Worse with movement.

## 2023-10-05 ENCOUNTER — Emergency Department (HOSPITAL_BASED_OUTPATIENT_CLINIC_OR_DEPARTMENT_OTHER)

## 2023-10-05 DIAGNOSIS — K8020 Calculus of gallbladder without cholecystitis without obstruction: Secondary | ICD-10-CM | POA: Diagnosis not present

## 2023-10-05 DIAGNOSIS — R079 Chest pain, unspecified: Secondary | ICD-10-CM | POA: Diagnosis not present

## 2023-10-05 DIAGNOSIS — N3289 Other specified disorders of bladder: Secondary | ICD-10-CM | POA: Diagnosis not present

## 2023-10-05 DIAGNOSIS — K7689 Other specified diseases of liver: Secondary | ICD-10-CM | POA: Diagnosis not present

## 2023-10-05 LAB — PREGNANCY, URINE: Preg Test, Ur: NEGATIVE

## 2023-10-05 LAB — TROPONIN T, HIGH SENSITIVITY: Troponin T High Sensitivity: <15 ng/L (ref 0–19)

## 2023-10-05 MED ORDER — IOHEXOL 350 MG/ML SOLN
100.0000 mL | Freq: Once | INTRAVENOUS | Status: AC | PRN
  Administered 2023-10-05: 100 mL via INTRAVENOUS

## 2023-10-05 NOTE — Discharge Instructions (Signed)
 Your testing today is reassuring.  No evidence of heart attack or blood clot in the lung.  Follow-up for your CT coronary study as scheduled.  Continue the pantoprazole .  Avoid alcohol, caffeine, NSAID medications, spicy foods.  Return to the ED with exertional chest pain, pain associate with shortness of breath, nausea, vomiting, sweating or other concerns.

## 2023-10-05 NOTE — ED Notes (Signed)
 Patient transported to CT

## 2023-10-06 DIAGNOSIS — K8020 Calculus of gallbladder without cholecystitis without obstruction: Secondary | ICD-10-CM | POA: Diagnosis not present

## 2023-10-06 DIAGNOSIS — R1011 Right upper quadrant pain: Secondary | ICD-10-CM | POA: Diagnosis not present

## 2023-10-06 DIAGNOSIS — R1013 Epigastric pain: Secondary | ICD-10-CM | POA: Diagnosis not present

## 2023-10-08 DIAGNOSIS — R072 Precordial pain: Secondary | ICD-10-CM | POA: Diagnosis not present

## 2023-10-15 DIAGNOSIS — K279 Peptic ulcer, site unspecified, unspecified as acute or chronic, without hemorrhage or perforation: Secondary | ICD-10-CM | POA: Diagnosis not present

## 2023-10-30 DIAGNOSIS — R519 Headache, unspecified: Secondary | ICD-10-CM | POA: Diagnosis not present

## 2023-12-24 DIAGNOSIS — M533 Sacrococcygeal disorders, not elsewhere classified: Secondary | ICD-10-CM | POA: Diagnosis not present

## 2023-12-24 DIAGNOSIS — M545 Low back pain, unspecified: Secondary | ICD-10-CM | POA: Diagnosis not present
